# Patient Record
Sex: Female | Born: 1965 | Race: White | Hispanic: No | Marital: Married | Smoking: Former smoker
Health system: Southern US, Community
[De-identification: ages and names within clinical notes are randomized; demographics above are authoritative.]

## PROBLEM LIST (undated history)

## (undated) DIAGNOSIS — M1990 Unspecified osteoarthritis, unspecified site: Secondary | ICD-10-CM

## (undated) DIAGNOSIS — J189 Pneumonia, unspecified organism: Secondary | ICD-10-CM

## (undated) DIAGNOSIS — M797 Fibromyalgia: Secondary | ICD-10-CM

## (undated) DIAGNOSIS — K219 Gastro-esophageal reflux disease without esophagitis: Secondary | ICD-10-CM

## (undated) DIAGNOSIS — D126 Benign neoplasm of colon, unspecified: Secondary | ICD-10-CM

## (undated) DIAGNOSIS — D649 Anemia, unspecified: Secondary | ICD-10-CM

## (undated) DIAGNOSIS — Z8709 Personal history of other diseases of the respiratory system: Secondary | ICD-10-CM

## (undated) DIAGNOSIS — R519 Headache, unspecified: Secondary | ICD-10-CM

## (undated) DIAGNOSIS — M549 Dorsalgia, unspecified: Secondary | ICD-10-CM

## (undated) DIAGNOSIS — F17200 Nicotine dependence, unspecified, uncomplicated: Secondary | ICD-10-CM

## (undated) DIAGNOSIS — E119 Type 2 diabetes mellitus without complications: Secondary | ICD-10-CM

## (undated) HISTORY — PX: CHOLECYSTECTOMY: SHX55

## (undated) HISTORY — PX: ANKLE SURGERY: SHX546

## (undated) HISTORY — PX: OOPHORECTOMY: SHX86

## (undated) HISTORY — PX: ABLATION: SHX5711

## (undated) HISTORY — PX: FOOT SURGERY: SHX648

## (undated) HISTORY — DX: Nicotine dependence, unspecified, uncomplicated: F17.200

## (undated) HISTORY — PX: KNEE ARTHROPLASTY: SHX992

## (undated) HISTORY — PX: EYE SURGERY: SHX253

## (undated) HISTORY — DX: Type 2 diabetes mellitus without complications: E11.9

## (undated) HISTORY — PX: LASIK: SHX215

## (undated) HISTORY — DX: Gastro-esophageal reflux disease without esophagitis: K21.9

## (undated) HISTORY — DX: Benign neoplasm of colon, unspecified: D12.6

## (undated) HISTORY — PX: ANKLE ARTHROPLASTY: SUR68

---

## 1999-12-28 ENCOUNTER — Encounter: Admission: RE | Admit: 1999-12-28 | Discharge: 1999-12-28 | Payer: Self-pay | Admitting: Family Medicine

## 1999-12-28 ENCOUNTER — Encounter: Payer: Self-pay | Admitting: Family Medicine

## 2001-02-14 ENCOUNTER — Encounter: Admission: RE | Admit: 2001-02-14 | Discharge: 2001-02-14 | Payer: Self-pay | Admitting: Chiropractic Medicine

## 2001-02-14 ENCOUNTER — Encounter: Payer: Self-pay | Admitting: Chiropractic Medicine

## 2001-03-11 ENCOUNTER — Other Ambulatory Visit: Admission: RE | Admit: 2001-03-11 | Discharge: 2001-03-11 | Payer: Self-pay | Admitting: Obstetrics and Gynecology

## 2001-12-24 ENCOUNTER — Encounter: Payer: Self-pay | Admitting: Obstetrics and Gynecology

## 2001-12-24 ENCOUNTER — Encounter: Admission: RE | Admit: 2001-12-24 | Discharge: 2001-12-24 | Payer: Self-pay | Admitting: Obstetrics and Gynecology

## 2002-04-07 ENCOUNTER — Other Ambulatory Visit: Admission: RE | Admit: 2002-04-07 | Discharge: 2002-04-07 | Payer: Self-pay | Admitting: Obstetrics and Gynecology

## 2002-07-21 ENCOUNTER — Encounter: Payer: Self-pay | Admitting: Emergency Medicine

## 2002-07-21 ENCOUNTER — Emergency Department (HOSPITAL_COMMUNITY): Admission: EM | Admit: 2002-07-21 | Discharge: 2002-07-21 | Payer: Self-pay | Admitting: Emergency Medicine

## 2002-08-17 ENCOUNTER — Encounter: Payer: Self-pay | Admitting: Obstetrics and Gynecology

## 2002-08-17 ENCOUNTER — Ambulatory Visit (HOSPITAL_COMMUNITY): Admission: RE | Admit: 2002-08-17 | Discharge: 2002-08-17 | Payer: Self-pay | Admitting: Obstetrics and Gynecology

## 2004-05-01 ENCOUNTER — Other Ambulatory Visit: Admission: RE | Admit: 2004-05-01 | Discharge: 2004-05-01 | Payer: Self-pay | Admitting: Obstetrics and Gynecology

## 2006-04-11 ENCOUNTER — Other Ambulatory Visit: Admission: RE | Admit: 2006-04-11 | Discharge: 2006-04-11 | Payer: Self-pay | Admitting: Obstetrics and Gynecology

## 2006-10-29 ENCOUNTER — Encounter: Admission: RE | Admit: 2006-10-29 | Discharge: 2006-10-29 | Payer: Self-pay | Admitting: Obstetrics and Gynecology

## 2007-11-06 ENCOUNTER — Encounter: Admission: RE | Admit: 2007-11-06 | Discharge: 2007-11-06 | Payer: Self-pay | Admitting: Obstetrics and Gynecology

## 2008-12-01 ENCOUNTER — Encounter: Admission: RE | Admit: 2008-12-01 | Discharge: 2008-12-01 | Payer: Self-pay | Admitting: Obstetrics and Gynecology

## 2010-01-31 ENCOUNTER — Encounter: Admission: RE | Admit: 2010-01-31 | Discharge: 2010-01-31 | Payer: Self-pay | Admitting: Obstetrics and Gynecology

## 2011-01-23 ENCOUNTER — Other Ambulatory Visit: Payer: Self-pay | Admitting: Obstetrics and Gynecology

## 2011-01-23 DIAGNOSIS — Z1231 Encounter for screening mammogram for malignant neoplasm of breast: Secondary | ICD-10-CM

## 2011-02-06 ENCOUNTER — Ambulatory Visit: Payer: Self-pay

## 2011-02-14 ENCOUNTER — Ambulatory Visit
Admission: RE | Admit: 2011-02-14 | Discharge: 2011-02-14 | Disposition: A | Discharge status: Home / Self Care | Payer: BC Managed Care – PPO | Source: Ambulatory Visit | Attending: Obstetrics and Gynecology | Admitting: Obstetrics and Gynecology

## 2011-02-14 DIAGNOSIS — Z1231 Encounter for screening mammogram for malignant neoplasm of breast: Secondary | ICD-10-CM

## 2011-02-16 ENCOUNTER — Other Ambulatory Visit: Payer: Self-pay | Admitting: Obstetrics and Gynecology

## 2011-02-16 DIAGNOSIS — R928 Other abnormal and inconclusive findings on diagnostic imaging of breast: Secondary | ICD-10-CM

## 2011-02-23 ENCOUNTER — Ambulatory Visit
Admission: RE | Admit: 2011-02-23 | Discharge: 2011-02-23 | Disposition: A | Discharge status: Home / Self Care | Payer: BC Managed Care – PPO | Source: Ambulatory Visit | Attending: Obstetrics and Gynecology | Admitting: Obstetrics and Gynecology

## 2011-02-23 DIAGNOSIS — R928 Other abnormal and inconclusive findings on diagnostic imaging of breast: Secondary | ICD-10-CM

## 2012-08-23 HISTORY — PX: KNEE ARTHROSCOPY: SUR90

## 2013-03-30 ENCOUNTER — Other Ambulatory Visit: Payer: Self-pay

## 2013-03-30 DIAGNOSIS — Z1231 Encounter for screening mammogram for malignant neoplasm of breast: Secondary | ICD-10-CM

## 2013-04-08 ENCOUNTER — Ambulatory Visit
Admission: RE | Admit: 2013-04-08 | Discharge: 2013-04-08 | Disposition: A | Discharge status: Home / Self Care | Payer: Self-pay | Source: Ambulatory Visit

## 2013-04-08 DIAGNOSIS — Z1231 Encounter for screening mammogram for malignant neoplasm of breast: Secondary | ICD-10-CM

## 2013-05-29 ENCOUNTER — Other Ambulatory Visit: Payer: Self-pay | Admitting: Physician Assistant

## 2013-05-29 DIAGNOSIS — R1011 Right upper quadrant pain: Secondary | ICD-10-CM

## 2013-06-04 ENCOUNTER — Ambulatory Visit
Admission: RE | Admit: 2013-06-04 | Discharge: 2013-06-04 | Disposition: A | Discharge status: Home / Self Care | Payer: BC Managed Care – PPO | Source: Ambulatory Visit | Attending: Physician Assistant | Admitting: Physician Assistant

## 2013-06-04 DIAGNOSIS — R1011 Right upper quadrant pain: Secondary | ICD-10-CM

## 2013-06-19 ENCOUNTER — Encounter (INDEPENDENT_AMBULATORY_CARE_PROVIDER_SITE_OTHER): Payer: Self-pay | Admitting: General Surgery

## 2013-06-22 ENCOUNTER — Ambulatory Visit (INDEPENDENT_AMBULATORY_CARE_PROVIDER_SITE_OTHER): Payer: BC Managed Care – PPO | Admitting: General Surgery

## 2013-06-22 ENCOUNTER — Encounter (INDEPENDENT_AMBULATORY_CARE_PROVIDER_SITE_OTHER): Payer: Self-pay

## 2013-06-22 ENCOUNTER — Encounter (INDEPENDENT_AMBULATORY_CARE_PROVIDER_SITE_OTHER): Payer: Self-pay | Admitting: General Surgery

## 2013-06-22 VITALS — BP 126/90 | HR 64 | Temp 98.6°F | Resp 14 | Ht 68.0 in | Wt 234.4 lb

## 2013-06-22 DIAGNOSIS — K8020 Calculus of gallbladder without cholecystitis without obstruction: Secondary | ICD-10-CM | POA: Insufficient documentation

## 2013-06-22 NOTE — Progress Notes (Signed)
Patient ID: Bethany Johns, female   DOB: [birth year], 47 y.o.   MRN: [id number]  Chief Complaint  Patient presents with  . Follow-up    EVAL CHOLELITHIASIS    HPI Bethany Johns is a 47 y.o. female.  She is referred by Dr. Barton Fanny is for evaluation and management of symptomatic gallstones.  The patient states that she has a six-month history of intermittent attacks of right upper quadrant pain this pain is sharp. This occurred in the afternoon, but she doesn't recognize any other graders. It happens every 3-4 days. No fever or chills. No nausea or vomiting. No diarrhea.  Recent more severe attacks led to evaluation. A CBC was normal recently. Liver function tests were normal in August of 2013, but we do not have any recent liver function tests. Ultrasound shows multiple mobile stones, no inflammation, CBD 5 mm.  Patient is essentially asymptomatic today. She denies any history of liver disease, cardiovascular disease or pulmonary disease.  The only comorbidities are mild obesity. No abdominal surgery. She does smoke a few cigarettes a week but not much. 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  HPI  Past Medical History  Diagnosis Date  . Diabetes mellitus without complication     Past Surgical History  Procedure Laterality Date  . Lasik      eye surgery  . Ankle surgery      torn ligament  . Knee arthroscopy  08/2012    Family History  Problem Relation Age of Onset  . Cancer Father     bladder    Social History History  Substance Use Topics  . Smoking status: Current Some Day Smoker -- 0.25 packs/day  . Smokeless tobacco: Never Used  . Alcohol Use: Yes     Comment: occasional    Allergies  Allergen Reactions  . Amoxicillin Rash      Review of Systems Review of Systems  Constitutional: Negative for fever, chills and unexpected weight change.  HENT: Negative for congestion, hearing loss, sore throat, trouble  swallowing and voice change.   Eyes: Negative for visual disturbance.  Respiratory: Negative for cough and wheezing.   Cardiovascular: Negative for chest pain, palpitations and leg swelling.  Gastrointestinal: Positive for abdominal pain. Negative for nausea, vomiting, diarrhea, constipation, blood in stool, abdominal distention and anal bleeding.  Genitourinary: Negative for hematuria, vaginal bleeding and difficulty urinating.  Musculoskeletal: Negative for arthralgias.  Skin: Negative for rash and wound.  Neurological: Negative for seizures, syncope and headaches.  Hematological: Negative for adenopathy. Does not bruise/bleed easily.  Psychiatric/Behavioral: Negative for confusion.    Blood pressure 126/90, pulse 64, temperature 98.6 F (37 C), temperature source Temporal, resp. rate 14, height 5\' 8"  (1.727 m), weight 234 lb 6.4 oz (106.323 kg).  Physical Exam Physical Exam  Constitutional: She is oriented to person, place, and time. She appears well-developed and well-nourished. No distress.  HENT:  Head: Normocephalic and atraumatic.  Nose: Nose normal.  Mouth/Throat: No oropharyngeal exudate.  Eyes: Conjunctivae and EOM are normal. Pupils are equal, round, and reactive to light. Left eye exhibits no discharge. No scleral icterus.  Neck: Neck supple. No JVD present. No tracheal deviation present. No thyromegaly present.  Cardiovascular: Normal rate, regular rhythm, normal heart sounds and intact distal pulses.   No murmur heard. Pulmonary/Chest: Effort normal and breath sounds normal. No respiratory distress. She has no wheezes. She has no rales. She exhibits no tenderness.  Abdominal: Soft.  Bowel sounds are normal. She exhibits no distension and no mass. There is no tenderness. There is no rebound and no guarding.  Mild, subjective, right subcostal tenderness. No guarding. No mass. No hernia.  Musculoskeletal: She exhibits no edema and no tenderness.  Lymphadenopathy:    She has  no cervical adenopathy.  Neurological: She is alert and oriented to person, place, and time. She exhibits normal muscle tone. Coordination normal.  Skin: Skin is warm. No rash noted. She is not diaphoretic. No erythema. No pallor.  Psychiatric: She has a normal mood and affect. Her behavior is normal. Judgment and thought content normal.    Data Reviewed Dr. Ephriam Knuckles office notes, lab work,  ultrasound.  Assessment    Chronic cholecystitis with cholelithiasis. Accelerating biliary colic. Patient is requesting cholecystectomy At the earliest convenience.  Mild obesity  Mild tobacco abuse     Plan    Schedule for laparoscopic cholecystectomy with cholangiogram, possible open.  I discussed the indications, details, techniques, and numerous risks of the surgery with her. She is aware of the risk of bleeding, infection, conversion to open laparotomy, postop bile leak, incisional hernia, injury to adjacent organs with major reconstructive surgery. She understands all these issues. All of her questions are answered. She agrees with this plan.        Angelia Mould. Derrell Lolling, M.D., [facility] General and Minimally invasive Surgery Breast and Colorectal Surgery Office:   [phone] Pager:   [phone]  06/22/2013, 5:16 PM

## 2013-06-22 NOTE — Patient Instructions (Signed)
You are having frequent attacks of abdominal pain, and this has been going on for almost 6 months. You have gallstones, and your symptoms are almost certainly due to your gallbladder, and almost certainly will continue until you have an operation to remove your gallbladder.  Follow a very low-fat diet  You'll be scheduled for laparoscopic cholecystectomy with cholangiogram in the very near future    Laparoscopic Cholecystectomy Laparoscopic cholecystectomy is surgery to remove the gallbladder. The gallbladder is located slightly to the right of center in the abdomen, behind the liver. It is a concentrating and storage sac for the bile produced in the liver. Bile aids in the digestion and absorption of fats. Gallbladder disease (cholecystitis) is an inflammation of your gallbladder. This condition is usually caused by a buildup of gallstones (cholelithiasis) in your gallbladder. Gallstones can block the flow of bile, resulting in inflammation and pain. In severe cases, emergency surgery may be required. When emergency surgery is not required, you will have time to prepare for the procedure. Laparoscopic surgery is an alternative to open surgery. Laparoscopic surgery usually has a shorter recovery time. Your common bile duct may also need to be examined and explored. Your caregiver will discuss this with you if he or she feels this should be done. If stones are found in the common bile duct, they may be removed. LET YOUR CAREGIVER KNOW ABOUT:  Allergies to food or medicine.  Medicines taken, including vitamins, herbs, eyedrops, over-the-counter medicines, and creams.  Use of steroids (by mouth or creams).  Previous problems with anesthetics or numbing medicines.  History of bleeding problems or blood clots.  Previous surgery.  Other health problems, including diabetes and kidney problems.  Possibility of pregnancy, if this applies. RISKS AND COMPLICATIONS All surgery is associated with  risks. Some problems that may occur following this procedure include:  Infection.  Damage to the common bile duct, nerves, arteries, veins, or other internal organs such as the stomach or intestines.  Bleeding.  A stone may remain in the common bile duct. BEFORE THE PROCEDURE  Do not take aspirin for 3 days prior to surgery or blood thinners for 1 week prior to surgery.  Do not eat or drink anything after midnight the night before surgery.  Let your caregiver know if you develop a cold or other infectious problem prior to surgery.  You should be present 60 minutes before the procedure or as directed. PROCEDURE  You will be given medicine that makes you sleep (general anesthetic). When you are asleep, your surgeon will make several small cuts (incisions) in your abdomen. One of these incisions is used to insert a small, lighted scope (laparoscope) into the abdomen. The laparoscope helps the surgeon see into your abdomen. Carbon dioxide gas will be pumped into your abdomen. The gas allows more room for the surgeon to perform your surgery. Other operating instruments are inserted through the other incisions. Laparoscopic procedures may not be appropriate when:  There is major scarring from previous surgery.  The gallbladder is extremely inflamed.  There are bleeding disorders or unexpected cirrhosis of the liver.  A pregnancy is near term.  Other conditions make the laparoscopic procedure impossible. If your surgeon feels it is not safe to continue with a laparoscopic procedure, he or she will perform an open abdominal procedure. In this case, the surgeon will make an incision to open the abdomen. This gives the surgeon a larger view and field to work within. This may allow the surgeon to perform  procedures that sometimes cannot be performed with a laparoscope alone. Open surgery has a longer recovery time. AFTER THE PROCEDURE  You will be taken to the recovery area where a nurse will  watch and check your progress.  You may be allowed to go home the same day.  Do not resume physical activities until directed by your caregiver.  You may resume a normal diet and activities as directed. Document Released: 06/11/2005 Document Revised: 09/03/2011 Document Reviewed: 01/21/2013 [facility] Patient Information 2014 [place], [place].

## 2013-06-23 ENCOUNTER — Encounter (HOSPITAL_COMMUNITY): Payer: Self-pay

## 2013-06-23 ENCOUNTER — Encounter (HOSPITAL_COMMUNITY)
Admission: RE | Admit: 2013-06-23 | Discharge: 2013-06-23 | Disposition: A | Discharge status: Home / Self Care | Payer: BC Managed Care – PPO | Source: Ambulatory Visit | Attending: General Surgery | Admitting: General Surgery

## 2013-06-23 ENCOUNTER — Encounter (INDEPENDENT_AMBULATORY_CARE_PROVIDER_SITE_OTHER): Payer: Self-pay

## 2013-06-23 ENCOUNTER — Telehealth (INDEPENDENT_AMBULATORY_CARE_PROVIDER_SITE_OTHER): Payer: Self-pay

## 2013-06-23 HISTORY — DX: Unspecified osteoarthritis, unspecified site: M19.90

## 2013-06-23 HISTORY — DX: Personal history of other diseases of the respiratory system: Z87.09

## 2013-06-23 HISTORY — DX: Dorsalgia, unspecified: M54.9

## 2013-06-23 HISTORY — DX: Pneumonia, unspecified organism: J18.9

## 2013-06-23 LAB — CBC WITH DIFFERENTIAL/PLATELET
Basophils Relative: 1 % (ref 0–1)
Eosinophils Absolute: 0.2 10*3/uL (ref 0.0–0.7)
Eosinophils Relative: 3 % (ref 0–5)
HCT: 40.9 % (ref 36.0–46.0)
Hemoglobin: 13.8 g/dL (ref 12.0–15.0)
Lymphocytes Relative: 22 % (ref 12–46)
Lymphs Abs: 1.8 10*3/uL (ref 0.7–4.0)
MCH: 31.3 pg (ref 26.0–34.0)
MCHC: 33.7 g/dL (ref 30.0–36.0)
MCV: 92.7 fL (ref 78.0–100.0)
Monocytes Absolute: 0.6 10*3/uL (ref 0.1–1.0)
Monocytes Relative: 8 % (ref 3–12)
Platelets: 289 10*3/uL (ref 150–400)
RBC: 4.41 MIL/uL (ref 3.87–5.11)
WBC: 8.4 10*3/uL (ref 4.0–10.5)

## 2013-06-23 LAB — HCG, SERUM, QUALITATIVE: Preg, Serum: NEGATIVE

## 2013-06-23 MED ORDER — CHLORHEXIDINE GLUCONATE 4 % EX LIQD: 1.0000 "application " | Freq: Once | CUTANEOUS | Status: DC

## 2013-06-23 NOTE — Telephone Encounter (Signed)
Message copied by Ivory Broad on Tue Jun 23, 2013  1:41 PM ------      Message from: Docia Chuck      Created: Tue Jun 23, 2013 11:50 AM      Regarding: Patient needs note       I have her scheduled for 06/26/13 she needs a note send to her stating she can return to work by 1/7 the earliest or 1/8.            Also the first follow up I could get was 07/27/13 so am not sure that is OK or if you can find her an earlier date, just let her know.            Thanks                         ------

## 2013-06-23 NOTE — Telephone Encounter (Signed)
Left message for pt to call me and I can type up her note for work.  I want to see if she wants the note faxed or mailed.  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  I will schedule her post op later.  I am unsure where to put her appointment.

## 2013-06-23 NOTE — Pre-Procedure Instructions (Signed)
Bethany Johns  06/23/2013   Your procedure is scheduled on:  Fri, Jan 2 @ 11:30 AM  Report to [facility] Stay [facility]tr[facility] A at 9:30 AM.  Call this number if you have problems the morning of surgery: [phone]   Remember:   Do not eat food or drink liquids after midnight.   Take these medicines the morning of surgery with A SIP OF WATER: Fluticasone(Flonase) and Claritin(Loratadine)               Stop taking your Ibuprofen. No Goody's,BC's,Aleve,Aspirin,Fish Oil,or any Herbal Medications   Do not wear jewelry, make-up or nail polish.  Do not wear lotions, powders, or perfumes. You may wear deodorant.  Do not shave 48 hours prior to surgery.   Do not bring valuables to the hospital.  [facility] is not responsible                  for any belongings or valuables.               Contacts, dentures or bridgework may not be worn into surgery.  Leave suitcase in the car. After surgery it may be brought to your room.  For patients admitted to the hospital, discharge time is determined by your                treatment team.               Patients discharged the day of surgery will not be allowed to drive  home.    Special Instructions: Shower using CHG 2 nights before surgery and the night before surgery.  If you shower the day of surgery use CHG.  Use special wash - you have one bottle of CHG for all showers.  You should use approximately 1/3 of the bottle for each shower.   Please read over the following fact sheets that you were given: Pain Booklet, Coughing and Deep Breathing and Surgical Site Infection Prevention

## 2013-06-23 NOTE — Progress Notes (Signed)
Pt doesn't have a cardiologist   Denies ever having an echo/stress test/heart cath  [facility] Dr.Victoria Rankin is Medical MD  Denies EKG or CXR in past yr

## 2013-06-24 NOTE — H&P (Signed)
Bethany Johns   MRN:  [id number]   Description: 47 year old female  Provider: Ernestene Mention, MD  Department: [facility]        Diagnoses      Gallstones    -  Primary      [phone]             Reason for Visit      Follow-up      EVAL CHOLELITHIASIS               Current Vitals - Last Recorded      BP Pulse Temp(Src) Resp Ht Wt      126/90 64 98.6 F (37 C) (Temporal) 14 5\' 8"  (1.727 m) 234 lb 6.4 oz (106.323 kg)     BMI - 35.65 kg/m2                             History and Physical    Ernestene Mention, MD   Status: Signed            Patient ID: Bethany Johns, female   DOB: 1965/11/12, 47 y.o.   MRN: [id number]                  HPI Bethany Johns is a 47 y.o. female.  She is referred by Dr. Barton Fanny is for evaluation and management of symptomatic gallstones.   The patient states that she has a six-month history of intermittent attacks of right upper quadrant pain this pain is sharp. This occurred in the afternoon, but she doesn't recognize any other graders. It happens every 3-4 days. No fever or chills. No nausea or vomiting. No diarrhea.   Recent more severe attacks led to evaluation. A CBC was normal recently. Liver function tests were normal in August of 2013, but we do not have any recent liver function tests. Ultrasound shows multiple mobile stones, no inflammation, CBD 5 mm.   Patient is essentially asymptomatic today. She denies any history of liver disease, cardiovascular disease or pulmonary disease.   The only comorbidities are mild obesity. No abdominal surgery. She does smoke a few cigarettes a week but not much. 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        Past Medical History   Diagnosis  Date   .  Diabetes mellitus without complication           Past Surgical History   Procedure  Laterality  Date   .  Lasik           eye surgery   .  Ankle surgery            torn ligament   .  Knee arthroscopy    08/2012         Family History   Problem  Relation  Age of Onset   .  Cancer  Father         bladder        Social History History   Substance Use Topics   .  Smoking status:  Current Some Day Smoker -- 0.25 packs/day   .  Smokeless tobacco:  Never Used   .  Alcohol Use:  Yes         Comment: occasional         Allergies   Allergen  Reactions   .  Amoxicillin  Rash          Review of Systems   Constitutional: Negative for fever, chills and unexpected weight change.  HENT: Negative for congestion, hearing loss, sore throat, trouble swallowing and voice change.   Eyes: Negative for visual disturbance.  Respiratory: Negative for cough and wheezing.   Cardiovascular: Negative for chest pain, palpitations and leg swelling.  Gastrointestinal: Positive for abdominal pain. Negative for nausea, vomiting, diarrhea, constipation, blood in stool, abdominal distention and anal bleeding.  Genitourinary: Negative for hematuria, vaginal bleeding and difficulty urinating.  Musculoskeletal: Negative for arthralgias.  Skin: Negative for rash and wound.  Neurological: Negative for seizures, syncope and headaches.  Hematological: Negative for adenopathy. Does not bruise/bleed easily.  Psychiatric/Behavioral: Negative for confusion.      Blood pressure 126/90, pulse 64, temperature 98.6 F (37 C), temperature source Temporal, resp. rate 14, height 5\' 8"  (1.727 m), weight 234 lb 6.4 oz (106.323 kg).   Physical Exam   Constitutional: She is oriented to person, place, and time. She appears well-developed and well-nourished. No distress.  HENT:   Head: Normocephalic and atraumatic.   Nose: Nose normal.   Mouth/Throat: No oropharyngeal exudate.  Eyes: Conjunctivae and EOM are normal. Pupils are equal, round, and reactive to light. Left eye exhibits no discharge. No scleral icterus.  Neck: Neck supple. No JVD present. No tracheal deviation  present. No thyromegaly present.  Cardiovascular: Normal rate, regular rhythm, normal heart sounds and intact distal pulses.    No murmur heard. Pulmonary/Chest: Effort normal and breath sounds normal. No respiratory distress. She has no wheezes. She has no rales. She exhibits no tenderness.  Abdominal: Soft. Bowel sounds are normal. She exhibits no distension and no mass. There is no tenderness. There is no rebound and no guarding.  Mild, subjective, right subcostal tenderness. No guarding. No mass. No hernia.  Musculoskeletal: She exhibits no edema and no tenderness.  Lymphadenopathy:    She has no cervical adenopathy.  Neurological: She is alert and oriented to person, place, and time. She exhibits normal muscle tone. Coordination normal.  Skin: Skin is warm. No rash noted. She is not diaphoretic. No erythema. No pallor.  Psychiatric: She has a normal mood and affect. Her behavior is normal. Judgment and thought content normal.      Data Reviewed Dr. Ephriam Knuckles office notes, lab work,  ultrasound.   Assessment    Chronic cholecystitis with cholelithiasis. Accelerating biliary colic. Patient is requesting cholecystectomy At the earliest convenience.   Mild obesity   Mild tobacco abuse      Plan    Schedule for laparoscopic cholecystectomy with cholangiogram, possible open.   I discussed the indications, details, techniques, and numerous risks of the surgery with her. She is aware of the risk of bleeding, infection, conversion to open laparotomy, postop bile leak, incisional hernia, injury to adjacent organs with major reconstructive surgery. She understands all these issues. All of her questions are answered. She agrees with this plan.           Angelia Mould. Derrell Lolling, M.D., [facility] General and Minimally invasive Surgery Breast and Colorectal Surgery Office:   [phone] Pager:   [phone]

## 2013-06-25 MED ORDER — CIPROFLOXACIN IN D5W 400 MG/200ML IV SOLN
400.0000 mg | INTRAVENOUS | Status: DC
  Filled 2013-06-25: qty 200

## 2013-06-26 ENCOUNTER — Encounter (HOSPITAL_COMMUNITY): Payer: Self-pay | Admitting: *Deleted

## 2013-06-26 ENCOUNTER — Encounter (HOSPITAL_COMMUNITY)
Admission: RE | Disposition: A | Discharge status: Home / Self Care | Payer: Self-pay | Source: Ambulatory Visit | Attending: General Surgery

## 2013-06-26 ENCOUNTER — Ambulatory Visit (HOSPITAL_COMMUNITY)
Admission: RE | Admit: 2013-06-26 | Discharge: 2013-06-26 | Disposition: A | Discharge status: Home / Self Care | Payer: BC Managed Care – PPO | Source: Ambulatory Visit | Attending: General Surgery | Admitting: General Surgery

## 2013-06-26 ENCOUNTER — Ambulatory Visit (HOSPITAL_COMMUNITY): Payer: BC Managed Care – PPO

## 2013-06-26 ENCOUNTER — Ambulatory Visit (HOSPITAL_COMMUNITY): Payer: BC Managed Care – PPO | Admitting: Anesthesiology

## 2013-06-26 ENCOUNTER — Encounter (HOSPITAL_COMMUNITY): Payer: BC Managed Care – PPO | Admitting: Anesthesiology

## 2013-06-26 DIAGNOSIS — F17200 Nicotine dependence, unspecified, uncomplicated: Secondary | ICD-10-CM | POA: Insufficient documentation

## 2013-06-26 DIAGNOSIS — E669 Obesity, unspecified: Secondary | ICD-10-CM | POA: Insufficient documentation

## 2013-06-26 DIAGNOSIS — K8010 Calculus of gallbladder with chronic cholecystitis without obstruction: Secondary | ICD-10-CM | POA: Insufficient documentation

## 2013-06-26 DIAGNOSIS — K824 Cholesterolosis of gallbladder: Secondary | ICD-10-CM

## 2013-06-26 DIAGNOSIS — Z01812 Encounter for preprocedural laboratory examination: Secondary | ICD-10-CM | POA: Insufficient documentation

## 2013-06-26 DIAGNOSIS — K8020 Calculus of gallbladder without cholecystitis without obstruction: Secondary | ICD-10-CM | POA: Diagnosis present

## 2013-06-26 DIAGNOSIS — E119 Type 2 diabetes mellitus without complications: Secondary | ICD-10-CM | POA: Insufficient documentation

## 2013-06-26 HISTORY — PX: CHOLECYSTECTOMY: SHX55

## 2013-06-26 SURGERY — LAPAROSCOPIC CHOLECYSTECTOMY WITH INTRAOPERATIVE CHOLANGIOGRAM
Anesthesia: General | Site: Abdomen

## 2013-06-26 MED ORDER — HYDROCODONE-ACETAMINOPHEN 5-325 MG PO TABS: 1.0000 | ORAL_TABLET | ORAL | Status: DC | PRN

## 2013-06-26 MED ORDER — SODIUM CHLORIDE 0.9 % IR SOLN
Status: DC | PRN
  Administered 2013-06-26: 1000 mL

## 2013-06-26 MED ORDER — ACETAMINOPHEN 650 MG RE SUPP: 650.0000 mg | RECTAL | Status: DC | PRN

## 2013-06-26 MED ORDER — LACTATED RINGERS IV SOLN
INTRAVENOUS | Status: DC
  Administered 2013-06-26: 10:00:00 via INTRAVENOUS

## 2013-06-26 MED ORDER — PHENYLEPHRINE HCL 10 MG/ML IJ SOLN
INTRAMUSCULAR | Status: DC | PRN
  Administered 2013-06-26: 80 ug via INTRAVENOUS

## 2013-06-26 MED ORDER — PROPOFOL 10 MG/ML IV BOLUS
INTRAVENOUS | Status: DC | PRN
  Administered 2013-06-26: 200 mg via INTRAVENOUS

## 2013-06-26 MED ORDER — SODIUM CHLORIDE 0.9 % IJ SOLN: 3.0000 mL | Freq: Two times a day (BID) | INTRAMUSCULAR | Status: DC

## 2013-06-26 MED ORDER — BUPIVACAINE-EPINEPHRINE 0.25% -1:200000 IJ SOLN
INTRAMUSCULAR | Status: DC | PRN
  Administered 2013-06-26: 8 mL

## 2013-06-26 MED ORDER — ONDANSETRON HCL 4 MG/2ML IJ SOLN
INTRAMUSCULAR | Status: DC | PRN
  Administered 2013-06-26: 4 mg via INTRAVENOUS

## 2013-06-26 MED ORDER — SODIUM CHLORIDE 0.9 % IJ SOLN: 3.0000 mL | INTRAMUSCULAR | Status: DC | PRN

## 2013-06-26 MED ORDER — LACTATED RINGERS IV SOLN
INTRAVENOUS | Status: DC | PRN
  Administered 2013-06-26 (×2): via INTRAVENOUS

## 2013-06-26 MED ORDER — OXYCODONE HCL 5 MG PO TABS
5.0000 mg | ORAL_TABLET | ORAL | Status: DC | PRN
  Administered 2013-06-26: 10 mg via ORAL

## 2013-06-26 MED ORDER — SODIUM CHLORIDE 0.9 % IV SOLN: 250.0000 mL | INTRAVENOUS | Status: DC | PRN

## 2013-06-26 MED ORDER — OXYCODONE HCL 5 MG PO TABS
ORAL_TABLET | ORAL | Status: AC
  Filled 2013-06-26: qty 2

## 2013-06-26 MED ORDER — ONDANSETRON HCL 4 MG/2ML IJ SOLN: 4.0000 mg | Freq: Once | INTRAMUSCULAR | Status: DC | PRN

## 2013-06-26 MED ORDER — ARTIFICIAL TEARS OP OINT
TOPICAL_OINTMENT | OPHTHALMIC | Status: DC | PRN
  Administered 2013-06-26: 1 via OPHTHALMIC

## 2013-06-26 MED ORDER — NEOSTIGMINE METHYLSULFATE 1 MG/ML IJ SOLN
INTRAMUSCULAR | Status: DC | PRN
  Administered 2013-06-26: 4 mg via INTRAVENOUS

## 2013-06-26 MED ORDER — ROCURONIUM BROMIDE 100 MG/10ML IV SOLN
INTRAVENOUS | Status: DC | PRN
  Administered 2013-06-26: 50 mg via INTRAVENOUS

## 2013-06-26 MED ORDER — BUPIVACAINE-EPINEPHRINE PF 0.25-1:200000 % IJ SOLN
INTRAMUSCULAR | Status: AC
  Filled 2013-06-26: qty 30

## 2013-06-26 MED ORDER — FENTANYL CITRATE 0.05 MG/ML IJ SOLN
INTRAMUSCULAR | Status: DC | PRN
  Administered 2013-06-26: 100 ug via INTRAVENOUS
  Administered 2013-06-26: 50 ug via INTRAVENOUS

## 2013-06-26 MED ORDER — HYDROMORPHONE HCL PF 1 MG/ML IJ SOLN
0.2500 mg | INTRAMUSCULAR | Status: DC | PRN
  Administered 2013-06-26 (×2): 0.5 mg via INTRAVENOUS

## 2013-06-26 MED ORDER — ONDANSETRON HCL 4 MG/2ML IJ SOLN: 4.0000 mg | Freq: Four times a day (QID) | INTRAMUSCULAR | Status: DC | PRN

## 2013-06-26 MED ORDER — MIDAZOLAM HCL 5 MG/5ML IJ SOLN
INTRAMUSCULAR | Status: DC | PRN
  Administered 2013-06-26: 2 mg via INTRAVENOUS

## 2013-06-26 MED ORDER — GLYCOPYRROLATE 0.2 MG/ML IJ SOLN
INTRAMUSCULAR | Status: DC | PRN
  Administered 2013-06-26: .8 mg via INTRAVENOUS
  Administered 2013-06-26: .2 mg via INTRAVENOUS

## 2013-06-26 MED ORDER — FENTANYL CITRATE 0.05 MG/ML IJ SOLN: 25.0000 ug | INTRAMUSCULAR | Status: DC | PRN

## 2013-06-26 MED ORDER — ACETAMINOPHEN 325 MG PO TABS: 650.0000 mg | ORAL_TABLET | ORAL | Status: DC | PRN

## 2013-06-26 MED ORDER — HYDROMORPHONE HCL PF 1 MG/ML IJ SOLN
INTRAMUSCULAR | Status: AC
  Filled 2013-06-26: qty 1

## 2013-06-26 MED ORDER — SODIUM CHLORIDE 0.9 % IV SOLN
INTRAVENOUS | Status: DC | PRN
  Administered 2013-06-26: 13:00:00

## 2013-06-26 MED ORDER — LIDOCAINE HCL (CARDIAC) 20 MG/ML IV SOLN
INTRAVENOUS | Status: DC | PRN
  Administered 2013-06-26: 100 mg via INTRAVENOUS

## 2013-06-26 MED ORDER — SODIUM CHLORIDE 0.9 % IV SOLN: INTRAVENOUS | Status: DC

## 2013-06-26 SURGICAL SUPPLY — 43 items
ADH SKN CLS APL DERMABOND .7 (GAUZE/BANDAGES/DRESSINGS) ×1
APPLIER CLIP ROT 10 11.4 M/L (STAPLE) ×3
APR CLP MED LRG 11.4X10 (STAPLE) ×1
BAG SPEC RTRVL LRG 6X4 10 (ENDOMECHANICALS) ×1
BLADE SURG ROTATE 9660 (MISCELLANEOUS) IMPLANT
CANISTER SUCTION 2500CC (MISCELLANEOUS) ×3 IMPLANT
CHLORAPREP W/TINT 26ML (MISCELLANEOUS) ×3 IMPLANT
CLIP APPLIE ROT 10 11.4 M/L (STAPLE) ×1 IMPLANT
COVER MAYO STAND STRL (DRAPES) ×3 IMPLANT
COVER SURGICAL LIGHT HANDLE (MISCELLANEOUS) ×3 IMPLANT
DECANTER SPIKE VIAL GLASS SM (MISCELLANEOUS) ×6 IMPLANT
DERMABOND ADVANCED (GAUZE/BANDAGES/DRESSINGS) ×2
DERMABOND ADVANCED .7 DNX12 (GAUZE/BANDAGES/DRESSINGS) ×1 IMPLANT
DRAPE C-ARM 42X72 X-RAY (DRAPES) ×3 IMPLANT
DRAPE UTILITY 15X26 W/TAPE STR (DRAPE) ×6 IMPLANT
ELECT REM PT RETURN 9FT ADLT (ELECTROSURGICAL) ×3
ELECTRODE REM PT RTRN 9FT ADLT (ELECTROSURGICAL) ×1 IMPLANT
GLOVE BIOGEL PI IND STRL 6.5 (GLOVE) IMPLANT
GLOVE BIOGEL PI IND STRL 7.0 (GLOVE) IMPLANT
GLOVE BIOGEL PI INDICATOR 6.5 (GLOVE) ×2
GLOVE BIOGEL PI INDICATOR 7.0 (GLOVE) ×2
GLOVE EUDERMIC 7 POWDERFREE (GLOVE) ×3 IMPLANT
GOWN STRL NON-REIN LRG LVL3 (GOWN DISPOSABLE) ×9 IMPLANT
GOWN STRL REIN XL XLG (GOWN DISPOSABLE) ×3 IMPLANT
KIT BASIN OR (CUSTOM PROCEDURE TRAY) ×3 IMPLANT
KIT ROOM TURNOVER OR (KITS) ×3 IMPLANT
NS IRRIG 1000ML POUR BTL (IV SOLUTION) ×3 IMPLANT
OMNIPAQUE 300MG/ML ×2 IMPLANT
PAD ARMBOARD 7.5X6 YLW CONV (MISCELLANEOUS) ×3 IMPLANT
POUCH SPECIMEN RETRIEVAL 10MM (ENDOMECHANICALS) ×3 IMPLANT
SCISSORS LAP 5X35 DISP (ENDOMECHANICALS) ×3 IMPLANT
SET CHOLANGIOGRAPH 5 50 .035 (SET/KITS/TRAYS/PACK) ×3 IMPLANT
SET IRRIG TUBING LAPAROSCOPIC (IRRIGATION / IRRIGATOR) ×3 IMPLANT
SLEEVE ENDOPATH XCEL 5M (ENDOMECHANICALS) ×3 IMPLANT
SPECIMEN JAR SMALL (MISCELLANEOUS) ×3 IMPLANT
SUT MNCRL AB 4-0 PS2 18 (SUTURE) ×3 IMPLANT
TOWEL OR 17X24 6PK STRL BLUE (TOWEL DISPOSABLE) ×3 IMPLANT
TOWEL OR 17X26 10 PK STRL BLUE (TOWEL DISPOSABLE) ×3 IMPLANT
TRAY LAPAROSCOPIC (CUSTOM PROCEDURE TRAY) ×3 IMPLANT
TROCAR XCEL BLUNT TIP 100MML (ENDOMECHANICALS) ×3 IMPLANT
TROCAR XCEL NON-BLD 11X100MML (ENDOMECHANICALS) ×3 IMPLANT
TROCAR XCEL NON-BLD 5MMX100MML (ENDOMECHANICALS) ×3 IMPLANT
TUBING INSUFFLATION (TUBING) ×2 IMPLANT

## 2013-06-26 NOTE — Transfer of Care (Signed)
Immediate Anesthesia Transfer of Care Note  Patient: Bethany Johns  Procedure(s) Performed: Procedure(s): LAPAROSCOPIC CHOLECYSTECTOMY WITH INTRAOPERATIVE CHOLANGIOGRAM, POSSIBLE OPEN (N/A)  Patient Location: PACU  Anesthesia Type:General  Level of Consciousness: awake, alert  and oriented  Airway & Oxygen Therapy: Patient Spontanous Breathing and Patient connected to nasal cannula oxygen  Post-op Assessment: Report given to PACU RN, Post -op Vital signs reviewed and stable and Patient moving all extremities X 4  Post vital signs: Reviewed and stable  Complications: No apparent anesthesia complications

## 2013-06-26 NOTE — Progress Notes (Signed)
NO C/O NAUSEA, PAIN, AND VOIDED

## 2013-06-26 NOTE — Anesthesia Preprocedure Evaluation (Signed)
Anesthesia Evaluation  Patient identified by MRN, date of birth, ID band Patient awake    Reviewed: Allergy & Precautions, H&P , NPO status , Patient's Chart, lab work & pertinent test results  Airway       Dental   Pulmonary Current Smoker,          Cardiovascular     Neuro/Psych    GI/Hepatic   Endo/Other    Renal/GU      Musculoskeletal   Abdominal   Peds  Hematology   Anesthesia Other Findings   Reproductive/Obstetrics                           Anesthesia Physical Anesthesia Plan  ASA: I  Anesthesia Plan: General   Post-op Pain Management:    Induction: Intravenous  Airway Management Planned: Oral ETT  Additional Equipment:   Intra-op Plan:   Post-operative Plan:   Informed Consent: I have reviewed the patients History and Physical, chart, labs and discussed the procedure including the risks, benefits and alternatives for the proposed anesthesia with the patient or authorized representative who has indicated his/her understanding and acceptance.     Plan Discussed with:   Anesthesia Plan Comments:         Anesthesia Quick Evaluation

## 2013-06-26 NOTE — Preoperative (Signed)
Beta Blockers   Reason not to administer Beta Blockers:Not Applicable 

## 2013-06-26 NOTE — Op Note (Signed)
Patient Name:           Bethany Johns   Date of Surgery:        06/26/2013  Pre op Diagnosis:      Chronic cholecystitis with cholelithiasis  Post op Diagnosis:    Same  Procedure:                 Laparoscopic cholecystectomy with cholangiogram  Surgeon:                     Edsel Petrin. Dalbert Batman, M.D., FACS  Assistant:                      Alphonsa Overall, M.D., FACS  Operative Indications:   Bethany Johns is a 48 y.o. female. She is referred by Dr. Harlene Ramus is for evaluation and management of symptomatic gallstones.  The patient states that she has a six-month history of intermittent attacks of right upper quadrant pain.   This pain is sharp. This occurs in the afternoon, usually,  but she doesn't recognize any other triggers. It happens every 3-4 days. No fever or chills. No nausea or vomiting. No diarrhea.     Recent more severe attacks led to evaluation. A CBC was normal recently. Liver function tests were normal in August of 2013, but we do not have any recent liver function tests. Ultrasound shows multiple mobile stones, no inflammation, CBD 5 mm.     Patient was essentially asymptomatic when evaluated last week. . She denies any history of liver disease, cardiovascular disease or pulmonary disease.  The only comorbidities are mild obesity. No abdominal surgery. She does smoke a few cigarettes a week but not much. She is brought to the operating room electively.   Operative Findings:       The gallbladder was thin-walled, discolored, suggesting chronic inflammation. The intraoperative cholangiogram was normal, showing normal intrahepatic and extrahepatic biliary anatomy, a very long cystic duct,  No dilatation, and no filling defects with good flow of contrast into the duodenum. The liver, duodenum, stomach, small intestine, and large intestine were otherwise grossly normal to inspection.  Procedure in Detail:          Following the induction of general endotracheal anesthesia the  patient's abdomen was prepped and draped in sterile fashion.Intravenous antibiotics were given and a surgical time out was performed. 0.5% Marcaine with epinephrine was used as a local infiltration anesthetic.    A vertical incision was made in the lower rim of the umbilicus. The fascia was incised in the midline and the abdominal cavity entered under direct vision. An 11 mm Hassan trocar was inserted and secured the pursestring suture of 0 Vicryl. Pneumoperitoneum was created and a video camera inserted. 11 mm trocars placed in the subxiphoid region and two 5 mm trocars were placed in the right subcostal region. The gallbladder was elevated. The peritoneum overlying the neck of the gallbladder was incised. The cystic duct and cystic artery were isolated and dissected until we could see 2 and only 2 structures going to the gallbladder. The cystic artery was secured with multiple metal clips and divided. A cholangiogram catheter was inserted into the cystic duct and a cholangiogram was obtained with the C-arm, with normal findings as described above. The cholangiogram catheter was then removed, the cystic duct secured with multiple  clips and divided. The gallbladder was dissected with the electrocautery , placed in a specimen bag and removed. Hemostasis was excellent.  The operative field and subphrenic spaces were irrigated. Irrigation fluid was completely clear without any evidence of bleeding or bile leak. The trocars were removed. The pneumoperitoneum was released. The fascia at the umbilicus was closed with 0 Vicryl sutures. Skin incisions were closed with subcuticular sutures of 4-0 Monocryl and Dermabond. The patient tolerated the procedure well and was taken to recovery room in stable condition. EBL 10 cc. Counts correct. Complications none.     Edsel Petrin. Dalbert Batman, M.D., FACS General and Minimally Invasive Surgery Breast and Colorectal Surgery  06/26/2013 1:19 PM

## 2013-06-26 NOTE — Discharge Instructions (Signed)
What to eat:  For your first meals, you should eat lightly; only small meals initially.  If you do not have nausea, you may eat larger meals.  Avoid spicy, greasy and heavy food.    General Anesthesia, Adult, Care After  Refer to this sheet in the next few weeks. These instructions provide you with information on caring for yourself after your procedure. Your health care provider may also give you more specific instructions. Your treatment has been planned according to current medical practices, but problems sometimes occur. Call your health care provider if you have any problems or questions after your procedure.  WHAT TO EXPECT AFTER THE PROCEDURE  After the procedure, it is typical to experience:  Sleepiness.  Nausea and vomiting. HOME CARE INSTRUCTIONS  For the first 24 hours after general anesthesia:  Have a responsible person with you.  Do not drive a car. If you are alone, do not take public transportation.  Do not drink alcohol.  Do not take medicine that has not been prescribed by your health care provider.  Do not sign important papers or make important decisions.  You may resume a normal diet and activities as directed by your health care provider.  Change bandages (dressings) as directed.  If you have questions or problems that seem related to general anesthesia, call the hospital and ask for the anesthetist or anesthesiologist on call. SEEK MEDICAL CARE IF:  You have nausea and vomiting that continue the day after anesthesia.  You develop a rash. SEEK IMMEDIATE MEDICAL CARE IF:  You have difficulty breathing.  You have chest pain.  You have any allergic problems. Document Released: 09/17/2000 Document Revised: 02/11/2013 Document Reviewed: 12/25/2012  [facility] Patient Information 2014 [place], [place].   Laparoscopic Cholecystectomy Care After These instructions give you information on caring for yourself after your procedure. Your doctor may also give you more specific  instructions. Call your doctor if you have any problems or questions after your procedure. HOME CARE  Change your bandages (dressings) as told by your doctor.  Keep the wound dry and clean. Wash the wound gently with soap and water. Pat the wound dry with a clean towel.  Do not take baths, swim, or use hot tubs for 10 days, or as told by your doctor.  Only take medicine as told by your doctor.  Eat a normal diet as told by your doctor.  Do not lift anything heavier than 25 pounds (11.5 kg), or as told by your doctor.  Do not play contact sports for 1 week, or as told by your doctor. GET HELP RIGHT AWAY IF:   Your wound is red, puffy (swollen), or painful.  You have yellowish-white fluid (pus) coming from the wound.  You have fluid draining from the wound for more than 1 day.  You have a bad smell coming from the wound.  Your wound breaks open.  You have a rash.  You have trouble breathing.  You have chest pain.  You have a bad reaction to your medicine.  You have a fever.  You have pain in the shoulders (shoulder strap areas).  You feel dizzy or pass out (faint).  You have severe belly (abdominal) pain.  You feel sick to your stomach (nauseous) or throw up (vomit) for more than 1 day. MAKE SURE YOU:  Understand these instructions.  Will watch your condition.  Will get help right away if you are not doing well or get worse. Document Released: 03/20/2008 Document Revised: 10/06/2012  Document Reviewed: 01/21/2013 [facility] Patient Information 2014 [place].

## 2013-06-26 NOTE — Interval H&P Note (Signed)
History and Physical Interval Note:  06/26/2013 9:47 AM  Bethany Johns  has presented today for surgery, with the diagnosis of gallstones  The goals and the various methods of treatment have been discussed with the patient and family. After consideration of risks, benefits and other options for treatment, the patient has consented to  Procedure(s): LAPAROSCOPIC CHOLECYSTECTOMY WITH INTRAOPERATIVE CHOLANGIOGRAM, POSSIBLE OPEN (N/A) as a surgical intervention .  The patient's history has been reviewed, patient examined, no change in status, stable for surgery.  I have reviewed the patient's chart and labs.  Questions were answered to the patient's satisfaction.     [name]

## 2013-06-29 NOTE — Anesthesia Postprocedure Evaluation (Signed)
  Anesthesia Post-op Note  Patient: Bethany Johns  Procedure(s) Performed: Procedure(s): LAPAROSCOPIC CHOLECYSTECTOMY WITH INTRAOPERATIVE CHOLANGIOGRAM, POSSIBLE OPEN (N/A)  Patient Location: PACU  Anesthesia Type:General  Level of Consciousness: awake, alert , oriented and patient cooperative  Airway and Oxygen Therapy: Patient Spontanous Breathing  Post-op Pain: mild  Post-op Assessment: Post-op Vital signs reviewed, Patient's Cardiovascular Status Stable, Respiratory Function Stable, Patent Airway, No signs of Nausea or vomiting and Pain level controlled  Post-op Vital Signs: stable  Complications: No apparent anesthesia complications

## 2013-07-01 ENCOUNTER — Encounter (INDEPENDENT_AMBULATORY_CARE_PROVIDER_SITE_OTHER): Payer: Self-pay | Admitting: General Surgery

## 2013-07-01 ENCOUNTER — Encounter (HOSPITAL_COMMUNITY): Payer: Self-pay | Admitting: General Surgery

## 2013-07-01 ENCOUNTER — Telehealth (INDEPENDENT_AMBULATORY_CARE_PROVIDER_SITE_OTHER): Payer: Self-pay | Admitting: General Surgery

## 2013-07-01 NOTE — Telephone Encounter (Signed)
Pt called to ask about pain at incision, described as "burning."  Reassured pt that this is not uncommon and to apply ice to site and use NSAIDs for their anti-inflammatory effects.  She will try this now.  Pt has just received FMLA paperwork; advised need to drop off and pay fee, so [employer] will process and sent to her employer.  Letter written per her request to [employer] with restrictions on Monday, 07/06/13.  She will pick up the letter at the front desk same time as leaving the [facility] papers.

## 2013-07-27 ENCOUNTER — Ambulatory Visit (INDEPENDENT_AMBULATORY_CARE_PROVIDER_SITE_OTHER): Payer: BC Managed Care – PPO | Admitting: General Surgery

## 2013-07-27 ENCOUNTER — Encounter (INDEPENDENT_AMBULATORY_CARE_PROVIDER_SITE_OTHER): Payer: Self-pay | Admitting: General Surgery

## 2013-07-27 VITALS — BP 122/86 | HR 76 | Temp 99.0°F | Resp 14 | Ht 68.0 in | Wt 232.6 lb

## 2013-07-27 DIAGNOSIS — K8020 Calculus of gallbladder without cholecystitis without obstruction: Secondary | ICD-10-CM

## 2013-07-27 NOTE — Patient Instructions (Addendum)
You seem to be recovering from your gallbladder surgery without any major complication.  You may resume all normal activities without restriction  Follow a low-fat diet.  Return to see Dr. Dalbert Batman if necessary.

## 2013-07-27 NOTE — Progress Notes (Signed)
Patient ID: Bethany Johns, female   DOB: 12/20/1965, 48 y.o.   MRN: [id number] History: This patient underwent laparoscopic cholecystectomy with cholangiogram on 06/26/2013 for histologically confirmed chronic cholecystitis with cholelithiasis. The cholangiogram was normal.  Exam: Patient appears well in no distress. Abdomen is soft and nontender. All trocar sites are healing well  Assessment: Chronic cholecystitis with cholelithiasis, recovering uneventfully in the postop period following laparoscopic cholecystectomy was cleansed and  Plan:  Diet as discussed Return as necessary.    Edsel Petrin. Dalbert Batman, M.D., [facility] General and Minimally invasive Surgery Breast and Colorectal Surgery Office:   [phone] Pager:   [phone]

## 2014-10-18 ENCOUNTER — Encounter: Payer: Self-pay | Admitting: Podiatry

## 2014-10-18 ENCOUNTER — Ambulatory Visit (INDEPENDENT_AMBULATORY_CARE_PROVIDER_SITE_OTHER): Payer: BLUE CROSS/BLUE SHIELD

## 2014-10-18 ENCOUNTER — Ambulatory Visit (INDEPENDENT_AMBULATORY_CARE_PROVIDER_SITE_OTHER): Payer: BLUE CROSS/BLUE SHIELD | Admitting: Podiatry

## 2014-10-18 DIAGNOSIS — M722 Plantar fascial fibromatosis: Secondary | ICD-10-CM

## 2014-10-18 DIAGNOSIS — M79672 Pain in left foot: Secondary | ICD-10-CM

## 2014-10-18 DIAGNOSIS — M79671 Pain in right foot: Secondary | ICD-10-CM | POA: Diagnosis not present

## 2014-10-18 MED ORDER — TRIAMCINOLONE ACETONIDE 10 MG/ML IJ SUSP
10.0000 mg | Freq: Once | INTRAMUSCULAR | Status: AC
  Administered 2014-10-18: 10 mg

## 2014-10-18 MED ORDER — DICLOFENAC SODIUM 75 MG PO TBEC: 75.0000 mg | DELAYED_RELEASE_TABLET | Freq: Two times a day (BID) | ORAL | Status: DC

## 2014-10-18 NOTE — Patient Instructions (Signed)

## 2014-10-18 NOTE — Progress Notes (Signed)
   Subjective:    Patient ID: Bethany Johns, female    DOB: [birth year], 49 y.o.   MRN: [id number]  HPI Pt presents with bilateral PF pain, worse in the right, requesting orthotics, has had injections in the past along with being casted, and has a h/o surgery on her right foot    Review of Systems  All other systems reviewed and are negative.      Objective:   Physical Exam        Assessment & Plan:

## 2014-10-19 NOTE — Progress Notes (Signed)
Subjective:     Patient ID: Bethany Johns, female   DOB: 28-Mar-1966, 49 y.o.   MRN: [id number]  HPI patient presents stating I been having a significant increase in my heel pain right over left and it never really has gotten better but I have lived with it but the last 6 months have been more difficult   Review of Systems  All other systems reviewed and are negative.      Objective:   Physical Exam  Constitutional: She is oriented to person, place, and time.  Cardiovascular: Intact distal pulses.   Musculoskeletal: Normal range of motion.  Neurological: She is oriented to person, place, and time.  Skin: Skin is warm.  Nursing note and vitals reviewed.  assuming neurovascular status intact muscle strength adequate with range of motion within normal limits. Patient is noted to have intense discomfort plantar fascia right over left at the insertion of the tendon into the calcaneus with fluid buildup noted around the medial band. Patient has good digital perfusion and is well oriented 3     Assessment:     Fasciitis right over left with inflammation and fluid     Plan:     H&P and x-rays reviewed with patient. Today I injected the plantar fascia right over left 3 mg Kenalog 5 mill grams Xylocaine Marcaine mixture and dispensed a fascially brace for the right. Scanned for new orthotics

## 2014-11-08 ENCOUNTER — Encounter: Payer: Self-pay | Admitting: Podiatry

## 2014-11-08 ENCOUNTER — Ambulatory Visit (INDEPENDENT_AMBULATORY_CARE_PROVIDER_SITE_OTHER): Payer: BLUE CROSS/BLUE SHIELD | Admitting: Podiatry

## 2014-11-08 VITALS — BP 127/75 | HR 79 | Resp 18

## 2014-11-08 DIAGNOSIS — M722 Plantar fascial fibromatosis: Secondary | ICD-10-CM | POA: Diagnosis not present

## 2014-11-08 MED ORDER — TRIAMCINOLONE ACETONIDE 10 MG/ML IJ SUSP
10.0000 mg | Freq: Once | INTRAMUSCULAR | Status: AC
  Administered 2014-11-08: 10 mg

## 2014-11-08 NOTE — Progress Notes (Signed)
Subjective:     Patient ID: Bethany Johns, female   DOB: January 19, 1966, 49 y.o.   MRN: [id number]  HPI patient presents stating my heel is still hurting me quite a bit but it seems to be more on the bottom outside than the bottom and side. Points to the right heel   Review of Systems     Objective:   Physical Exam Neurovascular status intact muscle strength adequate range of motion within normal limits with inflammation and pain on the plantar aspect right foot lateral side  plantar fascial tendon    Assessment:     Reviewed condition and did a careful injection from the lateral side 3 mg Kenalog 5 mg Xylocaine and advised on physical therapy and anti-inflammatories.    Plan:     Plantar fasciitis right lateral band noted today I also dispensed a night splint

## 2014-11-08 NOTE — Patient Instructions (Signed)

## 2014-12-06 ENCOUNTER — Encounter: Payer: Self-pay | Admitting: Podiatry

## 2014-12-06 ENCOUNTER — Ambulatory Visit (INDEPENDENT_AMBULATORY_CARE_PROVIDER_SITE_OTHER): Payer: BLUE CROSS/BLUE SHIELD | Admitting: Podiatry

## 2014-12-06 VITALS — BP 113/71 | HR 73 | Resp 15

## 2014-12-06 DIAGNOSIS — M722 Plantar fascial fibromatosis: Secondary | ICD-10-CM

## 2014-12-06 MED ORDER — TRAMADOL HCL 50 MG PO TABS: 50.0000 mg | ORAL_TABLET | Freq: Three times a day (TID) | ORAL | Status: DC

## 2014-12-07 ENCOUNTER — Ambulatory Visit (INDEPENDENT_AMBULATORY_CARE_PROVIDER_SITE_OTHER): Payer: BLUE CROSS/BLUE SHIELD | Admitting: Podiatry

## 2014-12-07 DIAGNOSIS — M722 Plantar fascial fibromatosis: Secondary | ICD-10-CM

## 2014-12-07 NOTE — Patient Instructions (Signed)
Pre-Operative Instructions  Congratulations, you have decided to take an important step to improving your quality of life.  You can be assured that the doctors of [facility] will be with you every step of the way.  1. Plan to be at the surgery center/hospital at least 1 (one) hour prior to your scheduled time unless otherwise directed by the surgical center/hospital staff.  You must have a responsible adult accompany you, remain during the surgery and drive you home.  Make sure you have directions to the surgical center/hospital and know how to get there on time. 2. For hospital based surgery you will need to obtain a history and physical form from your family physician within 1 month prior to the date of surgery- we will give you a form for you primary physician.  3. We make every effort to accommodate the date you request for surgery.  There are however, times where surgery dates or times have to be moved.  We will contact you as soon as possible if a change in schedule is required.   4. No Aspirin/Ibuprofen for one week before surgery.  If you are on aspirin, any non-steroidal anti-inflammatory medications (Mobic, Aleve, Ibuprofen) you should stop taking it 7 days prior to your surgery.  You make take Tylenol  For pain prior to surgery.  5. Medications- If you are taking daily heart and blood pressure medications, seizure, reflux, allergy, asthma, anxiety, pain or diabetes medications, make sure the surgery center/hospital is aware before the day of surgery so they may notify you which medications to take or avoid the day of surgery. 6. No food or drink after midnight the night before surgery unless directed otherwise by surgical center/hospital staff. 7. No alcoholic beverages 24 hours prior to surgery.  No smoking 24 hours prior to or 24 hours after surgery. 8. Wear loose pants or shorts- loose enough to fit over bandages, boots, and casts. 9. No slip on shoes, sneakers are best. 10. Bring  your boot with you to the surgery center/hospital.  Also bring crutches or a walker if your physician has prescribed it for you.  If you do not have this equipment, it will be provided for you after surgery. 11. If you have not been contracted by the surgery center/hospital by the day before your surgery, call to confirm the date and time of your surgery. 12. Leave-time from work may vary depending on the type of surgery you have.  Appropriate arrangements should be made prior to surgery with your employer. 13. Prescriptions will be provided immediately following surgery by your doctor.  Have these filled as soon as possible after surgery and take the medication as directed. 14. Remove nail polish on the operative foot. 15. Wash the night before surgery.  The night before surgery wash the foot and leg well with the antibacterial soap provided and water paying special attention to beneath the toenails and in between the toes.  Rinse thoroughly with water and dry well with a towel.  Perform this wash unless told not to do so by your physician.  Enclosed: 1 Ice pack (please put in freezer the night before surgery)   1 Hibiclens skin cleaner   Pre-op Instructions  If you have any questions regarding the instructions, do not hesitate to call our office.  [place]: [address] [place], [place] [address] [phone]  [place]: [address] [place], [place] [address] [phone]  [place]: [address]  [place], [place] [address] [phone]  Dr. Richard   Tuchman DPM, Dr. Norman Regal DPM Dr. Richard Sikora DPM, Dr. M. Todd Hyatt DPM, Dr. Kathryn Egerton DPM 

## 2014-12-07 NOTE — Progress Notes (Signed)
Subjective:     Patient ID: Bethany Johns, female   DOB: 12-04-1965, 49 y.o.   MRN: [id number]  HPI patient states she is still getting a lot of pain on the plantar aspect of her heel central and lateral band and that it's still causing her significant reduction of activity   Review of Systems     Objective:   Physical Exam Neurovascular status intact with pain in the center lateral aspect of the plantar fascia at its insertion into the calcaneus with fluid buildup noted    Assessment:     Advance plantar fasciitis of the center and lateral band right    Plan:     Discussed treatment options and she denies wanting to wear the boot and states that she will just use her night splint on occasional basis. At this point I recommended shockwave therapy and she is scheduled for this to be done tomorrow and I gave her instructions on night splint usage

## 2014-12-14 ENCOUNTER — Ambulatory Visit (INDEPENDENT_AMBULATORY_CARE_PROVIDER_SITE_OTHER): Payer: BLUE CROSS/BLUE SHIELD | Admitting: Podiatry

## 2014-12-14 DIAGNOSIS — M722 Plantar fascial fibromatosis: Secondary | ICD-10-CM

## 2014-12-14 NOTE — Progress Notes (Signed)
   Subjective:    Patient ID: Bethany Johns, female    DOB: 13-Mar-1966, 49 y.o.   MRN: [id number]  HPI  EPAT treatment number 2. Patient still has some heel pain, but overall improvement since she has started treatment.  Review of Systems     Objective:   Physical Exam        Assessment & Plan:

## 2014-12-14 NOTE — Progress Notes (Signed)
Subjective:     Patient ID: Bethany Johns, female   DOB: March 18, 1966, 49 y.o.   MRN: [id number]  HPI patient presents for endoscopic procedure of the right heel   Review of Systems     Objective:   Physical Exam Neurovascular status intact muscle strength adequate with no change in health history    Assessment:     Plantar fasciitis right    Plan:     Shockwave administered to the level 3.0-3000 shocks 17 frequency and tolerated well

## 2014-12-15 NOTE — Progress Notes (Signed)
Subjective:     Patient ID: Bethany Johns, female   DOB: 1966-01-31, 49 y.o.   MRN: [id number]  HPI patient states my heel seems somewhat better but still tender   Review of Systems     Objective:   Physical Exam Neurovascular status intact with the center and lateral band of the plantar fascia right still tender when pressed    Assessment:     Plantar fasciitis improved but present    Plan:     Shockwave administered 3000 shocks 4.2 intensity 17 frequency. Tolerated well and reappoint to recheck one week

## 2014-12-21 ENCOUNTER — Ambulatory Visit (INDEPENDENT_AMBULATORY_CARE_PROVIDER_SITE_OTHER): Payer: BLUE CROSS/BLUE SHIELD | Admitting: Podiatry

## 2014-12-21 ENCOUNTER — Encounter: Payer: Self-pay | Admitting: Podiatry

## 2014-12-21 VITALS — BP 112/78 | HR 75 | Resp 15

## 2014-12-21 DIAGNOSIS — M722 Plantar fascial fibromatosis: Secondary | ICD-10-CM

## 2014-12-22 NOTE — Progress Notes (Signed)
Subjective:     Patient ID: Bethany Johns, female   DOB: 20-Jun-1966, 49 y.o.   MRN: [id number]  HPI patient states that my heel is improving   Review of Systems     Objective:   Physical Exam Neurovascular status intact with pain in the center and lateral side of the plantar fascia right    Assessment:     Plantar fasciitis right improving with pain in the left plantar fascia    Plan:     Continue treatment of the right which was accomplished with 3000 shocks 4.6 intensity 17 frequency. We may  initiate treatment on the left but that will be decided based on response

## 2015-01-17 ENCOUNTER — Telehealth: Payer: Self-pay | Admitting: *Deleted

## 2015-01-17 NOTE — Telephone Encounter (Signed)
Pt states after vacation both her heels hurt horribly, should she wait to #4 EPAT for the right or come in sooner.  I spoke with pt and she is using the [place], ice and night splint.  Pt states she really would like to be seen before it got worse even if she didn't get to see Dr. Paulla Dolly.  I transferred pt for earlier appt.

## 2015-01-24 ENCOUNTER — Encounter: Payer: Self-pay | Admitting: Podiatry

## 2015-01-24 ENCOUNTER — Ambulatory Visit (INDEPENDENT_AMBULATORY_CARE_PROVIDER_SITE_OTHER): Payer: BLUE CROSS/BLUE SHIELD | Admitting: Podiatry

## 2015-01-24 DIAGNOSIS — M722 Plantar fascial fibromatosis: Secondary | ICD-10-CM

## 2015-01-26 NOTE — Progress Notes (Signed)
Subjective:     Patient ID: Bethany Johns, female   DOB: [birth year], 49 y.o.   MRN: [id number]  HPI patient presents stating I started to develop a lot of pain in both my feet and the right one was doing really well but then I was in the sand and now seems to be hurting me but the left heel has been very tender   Review of Systems     Objective:   Physical Exam Neurovascular status intact with muscle strength adequate and significant discomfort medial fascial band left with minimal discomfort on the right upon palpation but pain if she does a lot of walking    Assessment:     Acute plantar fasciitis left minimal symptoms on the right with doing well with after shockwave therapy right    Plan:     Initiate shockwave therapy on the left approximate 3000 to the heel at 17 frequency and 4.0 intensity. I also went ahead and did the arch of both feet with 1000 pulses at 3.0

## 2015-02-02 ENCOUNTER — Ambulatory Visit: Payer: BLUE CROSS/BLUE SHIELD | Admitting: Podiatry

## 2015-02-03 ENCOUNTER — Encounter: Payer: Self-pay | Admitting: Podiatry

## 2015-02-03 ENCOUNTER — Ambulatory Visit (INDEPENDENT_AMBULATORY_CARE_PROVIDER_SITE_OTHER): Payer: BLUE CROSS/BLUE SHIELD | Admitting: Podiatry

## 2015-02-03 DIAGNOSIS — M722 Plantar fascial fibromatosis: Secondary | ICD-10-CM

## 2015-02-03 NOTE — Progress Notes (Signed)
Subjective:     Patient ID: Bethany Johns, female   DOB: 1966/04/12, 49 y.o.   MRN: [id number]  HPI patient states I'm here for my second treatment on my left and my right still hurts me on the outside and bottom. States the left is some better from the first treatment   Review of Systems     Objective:   Physical Exam Discomfort noted in the medial and central band of the left plantar fascia and in the central and lateral band of the right plantar fascia    Assessment:     Plantar fasciitis still present and today we focused on the left and I did a 3000 shocks at 4.4 intensity at 17 frequency    Plan:     Above administered

## 2015-02-09 ENCOUNTER — Encounter: Payer: Self-pay | Admitting: Podiatry

## 2015-02-09 ENCOUNTER — Ambulatory Visit (INDEPENDENT_AMBULATORY_CARE_PROVIDER_SITE_OTHER): Payer: BLUE CROSS/BLUE SHIELD | Admitting: Podiatry

## 2015-02-09 DIAGNOSIS — M722 Plantar fascial fibromatosis: Secondary | ICD-10-CM

## 2015-02-09 NOTE — Progress Notes (Signed)
Subjective:     Patient ID: Bethany Johns, female   DOB: April 17, 1966, 49 y.o.   MRN: [id number]  HPI patient presents for shockwave #3 left stating she's doing some better but still can get discomfort   Review of Systems     Objective:   Physical Exam Neurovascular status intact with discomfort in the plantar aspect of the left heel right heel at this time but moderate in its intensity    Assessment:     I believe improving plantar fasciitis bilateral    Plan:     Shockwave #3 administered left 3000 shocks at 4.6 intensity 17 frequency and reappoint 4 weeks we will consider doing both at the same time

## 2015-02-15 ENCOUNTER — Ambulatory Visit: Payer: BLUE CROSS/BLUE SHIELD | Admitting: Podiatry

## 2015-03-09 ENCOUNTER — Ambulatory Visit (INDEPENDENT_AMBULATORY_CARE_PROVIDER_SITE_OTHER): Payer: BLUE CROSS/BLUE SHIELD | Admitting: Podiatry

## 2015-03-09 ENCOUNTER — Encounter: Payer: Self-pay | Admitting: Podiatry

## 2015-03-09 DIAGNOSIS — M722 Plantar fascial fibromatosis: Secondary | ICD-10-CM | POA: Diagnosis not present

## 2015-03-09 NOTE — Progress Notes (Signed)
Subjective:     Patient ID: Bethany Johns, female   DOB: 05/17/1966, 49 y.o.   MRN: [id number]  HPI patient states I cannot take the pain in my left foot anymore or my right heel and I want surgery   Review of Systems     Objective:   Physical Exam Neurovascular status intact muscle strength adequate range of motion within normal limits with patient having significant discomfort plantar fascial left and right at the insertional point tendon into the calcaneus of long-term nature with failure to respond to numerous conservative treatments    Assessment:     Chronic plantar fasciitis bilateral    Plan:     Reviewed condition and recommended endoscopic release of the fascial band. We will do one at a time and we'll do the left one first and I allowed her to read consent form discussing alternative treatments and complications. Patient is comfortable with this wants surgery signed consent form after extensive review. She understands total recovery can take up to 6 months and some people will get pain in her arch or other discomfort. At this time I dispensed air fracture walker for postoperative usage and gave her all preoperative instructions and set the date for surgery and encouraged to call with any questions prior to procedure

## 2015-03-09 NOTE — Patient Instructions (Signed)
Pre-Operative Instructions  Congratulations, you have decided to take an important step to improving your quality of life.  You can be assured that the doctors of [facility] will be with you every step of the way.  1. Plan to be at the surgery center/hospital at least 1 (one) hour prior to your scheduled time unless otherwise directed by the surgical center/hospital staff.  You must have a responsible adult accompany you, remain during the surgery and drive you home.  Make sure you have directions to the surgical center/hospital and know how to get there on time. 2. For hospital based surgery you will need to obtain a history and physical form from your family physician within 1 month prior to the date of surgery- we will give you a form for you primary physician.  3. We make every effort to accommodate the date you request for surgery.  There are however, times where surgery dates or times have to be moved.  We will contact you as soon as possible if a change in schedule is required.   4. No Aspirin/Ibuprofen for one week before surgery.  If you are on aspirin, any non-steroidal anti-inflammatory medications (Mobic, Aleve, Ibuprofen) you should stop taking it 7 days prior to your surgery.  You make take Tylenol  For pain prior to surgery.  5. Medications- If you are taking daily heart and blood pressure medications, seizure, reflux, allergy, asthma, anxiety, pain or diabetes medications, make sure the surgery center/hospital is aware before the day of surgery so they may notify you which medications to take or avoid the day of surgery. 6. No food or drink after midnight the night before surgery unless directed otherwise by surgical center/hospital staff. 7. No alcoholic beverages 24 hours prior to surgery.  No smoking 24 hours prior to or 24 hours after surgery. 8. Wear loose pants or shorts- loose enough to fit over bandages, boots, and casts. 9. No slip on shoes, sneakers are best. 10. Bring  your boot with you to the surgery center/hospital.  Also bring crutches or a walker if your physician has prescribed it for you.  If you do not have this equipment, it will be provided for you after surgery. 11. If you have not been contracted by the surgery center/hospital by the day before your surgery, call to confirm the date and time of your surgery. 12. Leave-time from work may vary depending on the type of surgery you have.  Appropriate arrangements should be made prior to surgery with your employer. 13. Prescriptions will be provided immediately following surgery by your doctor.  Have these filled as soon as possible after surgery and take the medication as directed. 14. Remove nail polish on the operative foot. 15. Wash the night before surgery.  The night before surgery wash the foot and leg well with the antibacterial soap provided and water paying special attention to beneath the toenails and in between the toes.  Rinse thoroughly with water and dry well with a towel.  Perform this wash unless told not to do so by your physician.  Enclosed: 1 Ice pack (please put in freezer the night before surgery)   1 Hibiclens skin cleaner   Pre-op Instructions  If you have any questions regarding the instructions, do not hesitate to call our office.  [place]: [address] [place], [place] [address] [phone]  [place]: [address] [place], [place] [address] [phone]  [place]: [address]  [place],  [address] [phone]  Dr. Richard   Tuchman DPM, Dr. Norman Regal DPM Dr. Richard Sikora DPM, Dr. M. Todd Hyatt DPM, Dr. Kathryn Egerton DPM 

## 2015-03-14 ENCOUNTER — Telehealth: Payer: Self-pay | Admitting: *Deleted

## 2015-03-14 NOTE — Telephone Encounter (Signed)
"  I don't mean to call in panic mode but I haven't heard from the surgical center about a time.  I'm still scheduled aren't I?"  Yes, you are still scheduled.  You should hear from the surgical center a day or 2 prior to surgery date.  They may call you this Friday.  "Okay, I didn't know if I needed to fill out some paperwork or what before I go."  Did you register with them on-line?  "No, I don't have access to a computer.  Only computer I use is through work and they have stuff blocked."  Okay, just give the surgical center a call and give them the needed information.  "Okay I will, thank you."

## 2015-03-22 DIAGNOSIS — M722 Plantar fascial fibromatosis: Secondary | ICD-10-CM | POA: Diagnosis not present

## 2015-03-25 ENCOUNTER — Telehealth: Payer: Self-pay | Admitting: *Deleted

## 2015-03-25 NOTE — Telephone Encounter (Signed)
Pt asked if she was to get an antibiotic post op.  I told her Dr. Paulla Dolly did not always give antibiotics post op.  Pt states understanding.

## 2015-03-28 ENCOUNTER — Telehealth: Payer: Self-pay | Admitting: *Deleted

## 2015-03-28 NOTE — Telephone Encounter (Signed)
I attempted to call patient to see how she was doing following surgery.  Line was busy.

## 2015-03-28 NOTE — Telephone Encounter (Signed)
Pt asked if she could change her dressing.  Left her a message the dressing stayed in place until the 04/01/2015 appt.

## 2015-03-29 NOTE — Telephone Encounter (Signed)
Pt states missed my call yesterday, still has questions.  Pt states she is back a work as of yesterday, now experiencing aching and swelling, is at desk with the foot propped some, and up walking some but not a lot.  I told pt that what she was experiencing was normal the more she had to be up walking the more swelling, and aching, but her foot would eventually accommodate the day to day swelling and the surgical swelling especially if she wore the ace to help "train" the foot not to swell.  Pt states she is having more pain in the back of the heel feels like it is where the heel rubs the boot.  I told the pt that in this point and time she should only be up on the surgical foot 10-15 minutes/hour, and the increased walking and decreased dressing would also cause the sensation of rubbing, to decrease the walking and may want to either slightly tug cam walker sock forward in the frame or pad off the painful area.  Pt asked if she could wipe exposed skin to clean, and I told her that would be fine.

## 2015-04-01 ENCOUNTER — Ambulatory Visit (INDEPENDENT_AMBULATORY_CARE_PROVIDER_SITE_OTHER): Payer: BLUE CROSS/BLUE SHIELD | Admitting: Podiatry

## 2015-04-01 DIAGNOSIS — Z9889 Other specified postprocedural states: Secondary | ICD-10-CM

## 2015-04-01 DIAGNOSIS — M722 Plantar fascial fibromatosis: Secondary | ICD-10-CM | POA: Diagnosis not present

## 2015-04-02 NOTE — Progress Notes (Signed)
Subjective:     Patient ID: Bethany Johns, female   DOB: 08-12-1965, 49 y.o.   MRN: [id number]  HPI patient states I'm doing great with my left heel and I would like to get my right heel fixed as soon as possible   Review of Systems     Objective:   Physical Exam Neurovascular status intact negative Homans sign noted wound edges well coapted with minimal plantar pain upon palpation left    Assessment:     Improving from endoscopic release medial fascial band left    Plan:     Reviewed condition and reapplied sterile dressing and instructed on suture removal. For the right allow patient to review consent form going over all possible complications as listed and patient wants endoscopic release right which will be done in approximately 3 weeks. Understands risk and there is no guarantees that just because 1 did so well that the other one well and she understands this and accept risk and wants surgery

## 2015-04-15 ENCOUNTER — Encounter: Payer: Self-pay | Admitting: Podiatry

## 2015-04-15 ENCOUNTER — Ambulatory Visit (INDEPENDENT_AMBULATORY_CARE_PROVIDER_SITE_OTHER): Payer: BLUE CROSS/BLUE SHIELD | Admitting: Podiatry

## 2015-04-15 VITALS — BP 126/79 | HR 76 | Resp 18

## 2015-04-15 DIAGNOSIS — Z9889 Other specified postprocedural states: Secondary | ICD-10-CM

## 2015-04-15 DIAGNOSIS — M722 Plantar fascial fibromatosis: Secondary | ICD-10-CM

## 2015-04-18 NOTE — Progress Notes (Signed)
Patient ID: Bethany Johns, female   DOB: [birth year], 49 y.o.   MRN: [id number]  Subjective: 49 year old female presents the office today status post left endoscopic plantar fascial release with Dr. Paulla Dolly. She presents they for suture removal on the left foot. She states that she is doing well and she has very minimal discomfort. Has been wearing the surgical shoe. She has been icing and elevating. She has not required pain medicine. No other complaints this time. Denies any systemic complaints as fevers, chills, nausea, vomiting. No calf pain, chest pain, shortness of breath. No other complaints at this time.  Objective: AAO 3, NAD DP/PT pulses 2/4, CRT less than 3 seconds Protective sensation intact with Simms Weinstein monofilament Sutures are intact the left rear foot from recent surgery. The incisions are both well coapted without any evidence dehiscence and sutures are intact. There is no surrounding erythema, ascending cellulitis, proximal, crepitus, malodor. There is no drainage. There is no severe edema over surgical site. There is no tenderness to the plantar medial tubercle of the calcaneus to the insertion of plantar fascia. No open lesions or pre-ulcerative lesions. No pain with calf compression, swelling, warmth, erythema.  Assessment: 49 year old female status post left endoscopic plantar fascial release doing well  Plan: -Treatment options discussed including all alternatives, risks, and complications -Sutures removed without complications. Antibiotic was placed over the incisions followed by a dressing. She can with the bandages tomorrow and start to shower along the incision remains closed. Physician problems call the office. -Continue ice and elevation -Recommended continue the night splint or cam boot at night. Range of motion/rehabilitation exercises for plantar fasciitis -She can start to transition to a regular shoe as tolerated. -She is scheduled to have surgery in the right  foot in about 2 weeks. Discussed with her that as any issues prior to the surgery she should follow-up with Dr. Paulla Dolly before.  Celesta Gentile, DPM

## 2015-04-26 DIAGNOSIS — M722 Plantar fascial fibromatosis: Secondary | ICD-10-CM | POA: Diagnosis not present

## 2015-05-06 ENCOUNTER — Ambulatory Visit (INDEPENDENT_AMBULATORY_CARE_PROVIDER_SITE_OTHER): Payer: BLUE CROSS/BLUE SHIELD | Admitting: Podiatry

## 2015-05-06 DIAGNOSIS — Z9889 Other specified postprocedural states: Secondary | ICD-10-CM

## 2015-05-06 DIAGNOSIS — M722 Plantar fascial fibromatosis: Secondary | ICD-10-CM

## 2015-05-08 NOTE — Progress Notes (Signed)
Subjective:     Patient ID: Bethany Johns, female   DOB: 07-Nov-1965, 49 y.o.   MRN: [id number]  HPI patient states I'm doing real well with my right foot and I am having minimal discomfort   Review of Systems     Objective:   Physical Exam Neurovascular status intact muscle strength adequate with well-healing surgical site medial and lateral heel right with wound edges well coapted and good alignment noted    Assessment:     Doing well post endoscopic surgery right heel    Plan:     Reapplied sterile dressing instructed on continued immobilization and dispensed surgical shoe. Reappoint for suture removal 2 weeks or earlier if needed

## 2015-05-30 ENCOUNTER — Telehealth: Payer: Self-pay | Admitting: Podiatry

## 2015-05-30 NOTE — Telephone Encounter (Signed)
Left vm for pt. To call to r/s appt

## 2015-06-02 IMAGING — US US ABDOMEN LIMITED
1 series · 14 of 25 positions shown · non-contrast
Comparison: None.

CLINICAL DATA: Right upper quadrant pain

EXAM:
US ABDOMEN LIMITED - RIGHT UPPER QUADRANT

[Series 1: us abdomen limited · 0.30mm/px · 14 of 44 slices shown]
[im 1/44]
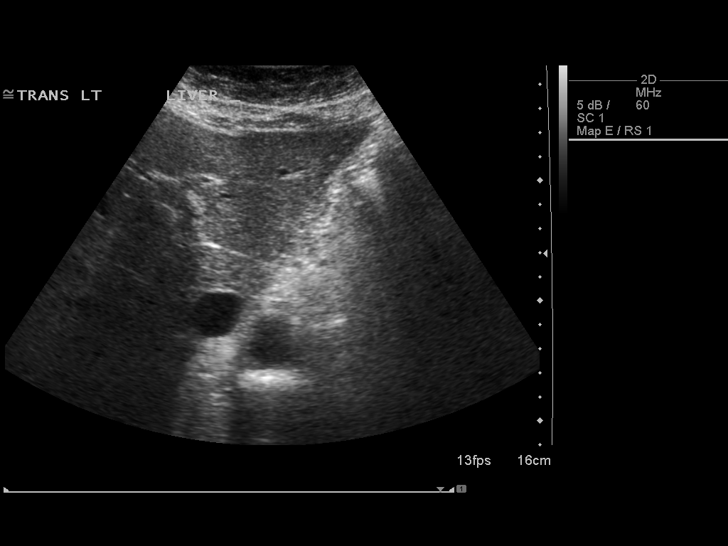
[im 4/44]
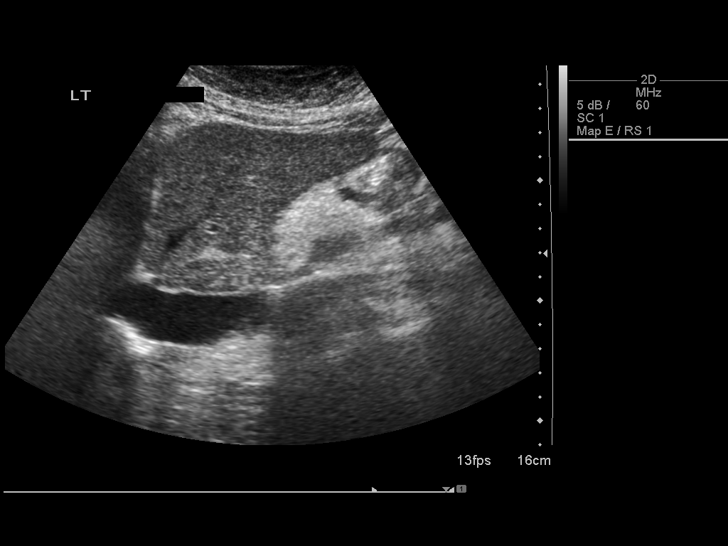
[im 8/44]
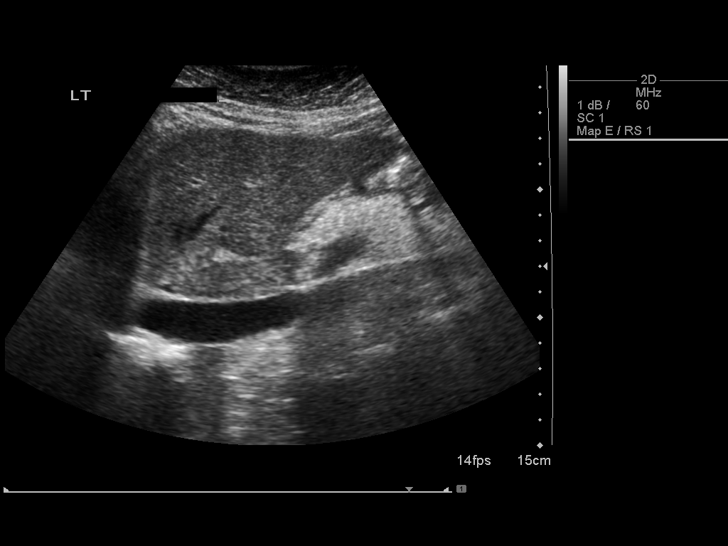
[im 11/44]
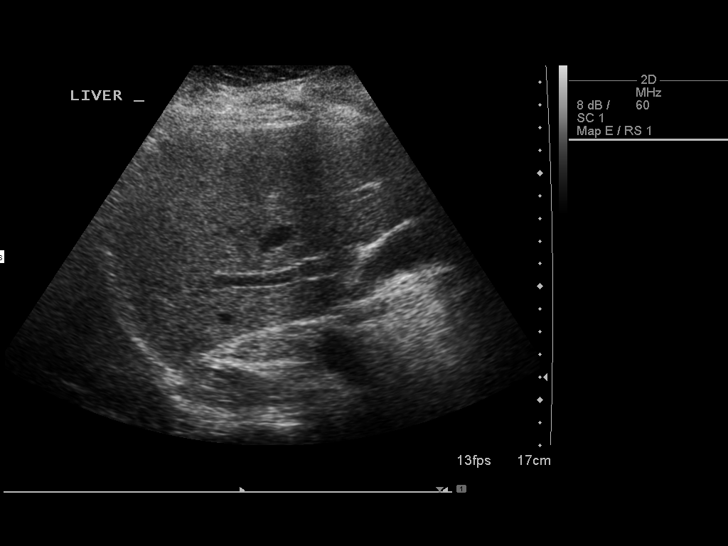
[im 15/44]
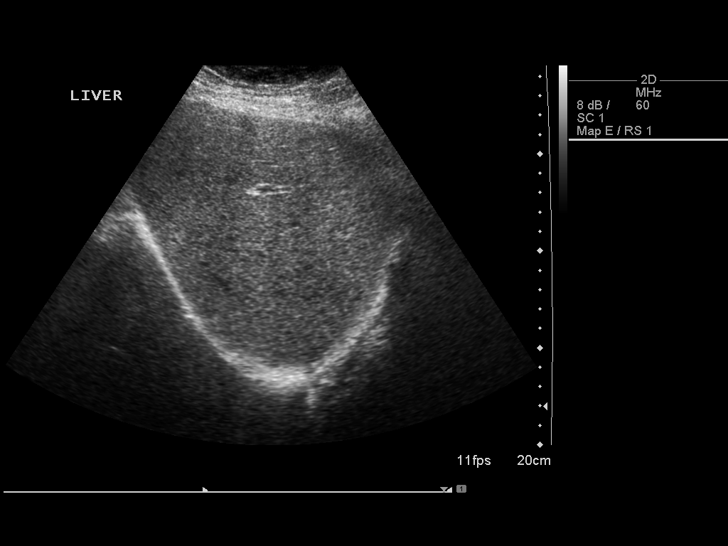
[im 17/44]
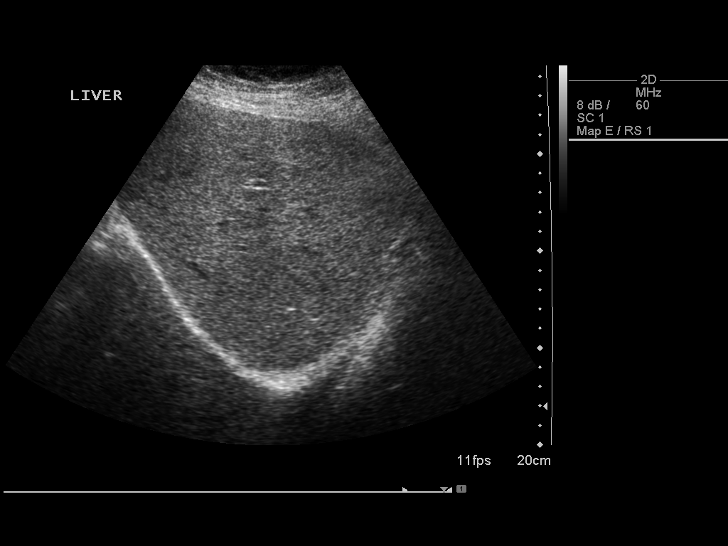
[im 20/44]
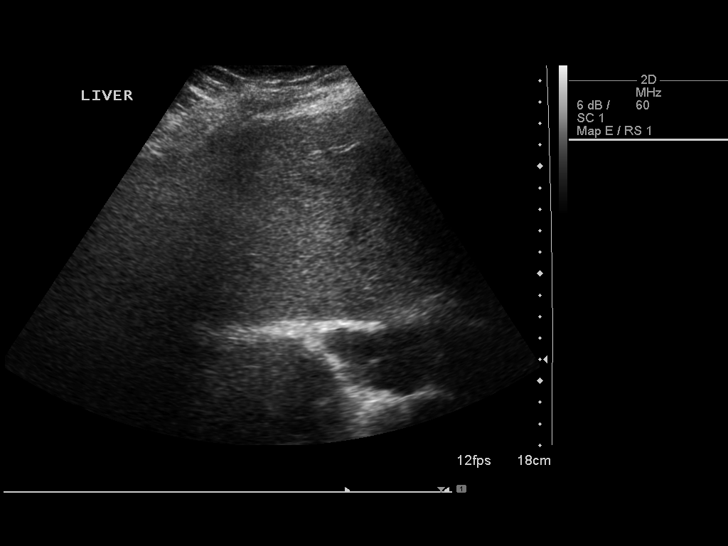
[im 24/44]
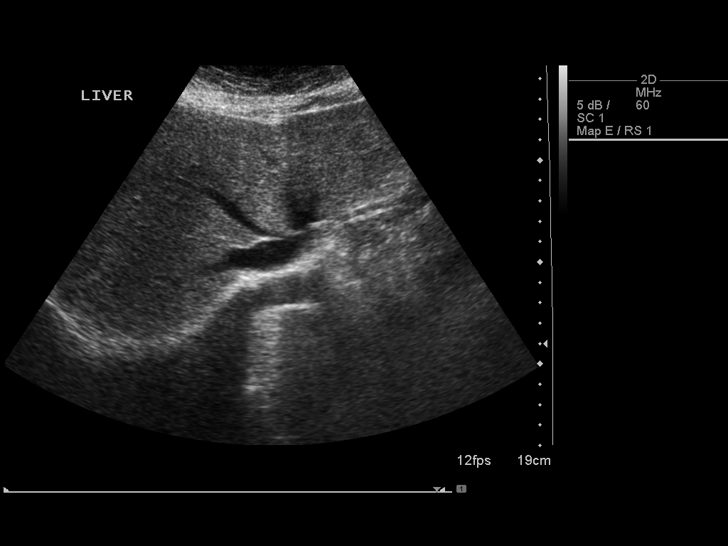
[im 27/44]
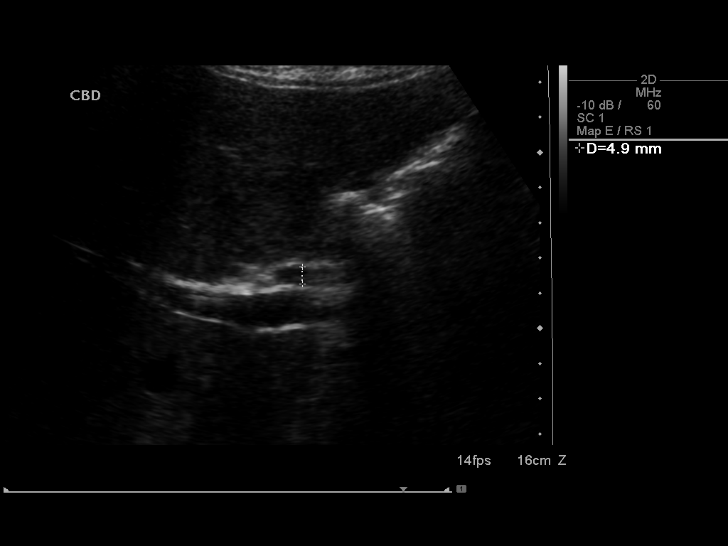
[im 29/44]
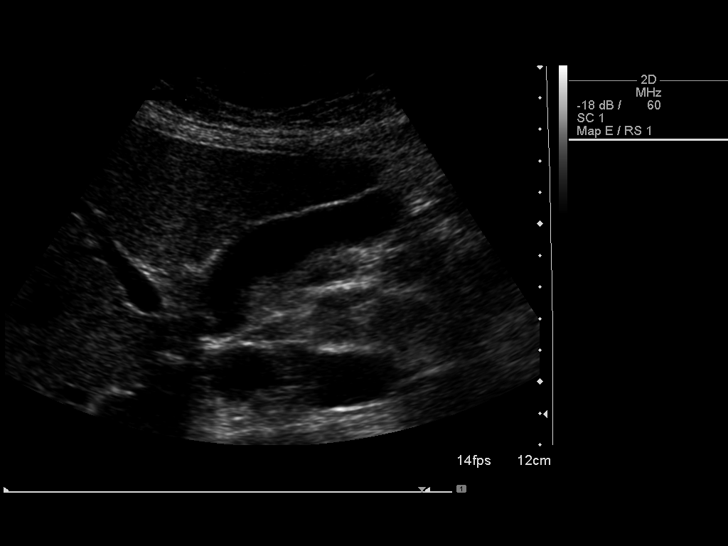
[im 33/44]
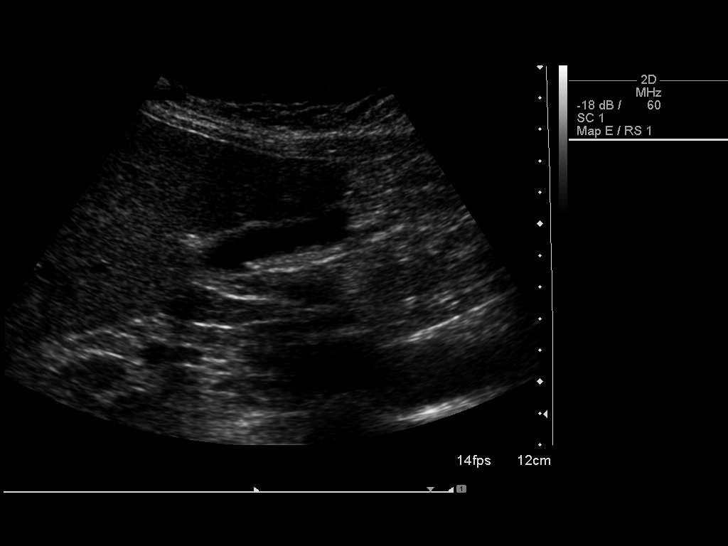
[im 36/44]
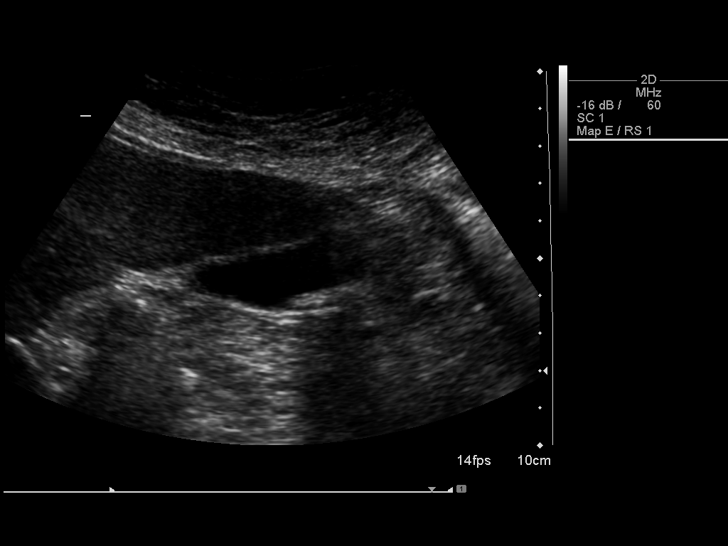
[im 40/44]
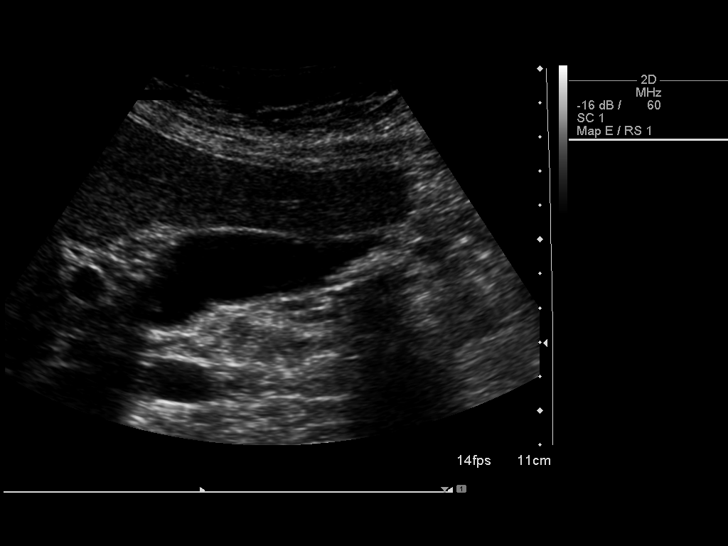
[im 44/44]
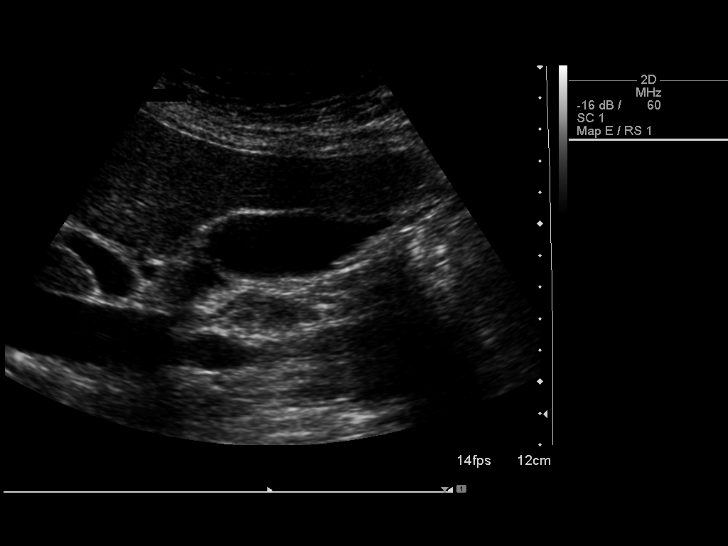

[14 of 25 positions shown; findings below may reference images not displayed]

FINDINGS: Gallbladder

Within the gallbladder, there are echogenic foci which move and
shadow consistent with gallstones. There is no gallbladder wall
thickening or pericholecystic fluid. No sonographic Murphy sign
noted.

Common bile duct

Diameter: 5 mm. There is no intrahepatic or extrahepatic biliary
duct dilatation.

Liver:

No focal lesion identified. Within normal limits in parenchymal
echogenicity.
IMPRESSION: Cholelithiasis.  Study otherwise unremarkable.

## 2015-06-03 ENCOUNTER — Ambulatory Visit (INDEPENDENT_AMBULATORY_CARE_PROVIDER_SITE_OTHER): Payer: BLUE CROSS/BLUE SHIELD | Admitting: Podiatry

## 2015-06-03 DIAGNOSIS — Z9889 Other specified postprocedural states: Secondary | ICD-10-CM

## 2015-06-03 DIAGNOSIS — M722 Plantar fascial fibromatosis: Secondary | ICD-10-CM

## 2015-06-03 NOTE — Patient Instructions (Signed)

## 2015-06-05 NOTE — Progress Notes (Signed)
Subjective:     Patient ID: Bethany Johns, female   DOB: Dec 29, 1965, 49 y.o.   MRN: [id number]  HPI patient states I'm doing well with my right heel   Review of Systems     Objective:   Physical Exam Neurovascular status intact negative Homans sign noted wound edges well coapted medial lateral right heel with minimal discomfort upon plantar palpation    Assessment:     Doing well post endoscopic surgery right    Plan:     Advised on physical therapy reapplied sterile dressing and advised on continued immobilization. Reappoint 2 weeks for stitch removal or earlier if any issues should occur

## 2015-06-13 ENCOUNTER — Encounter (HOSPITAL_COMMUNITY): Payer: Self-pay

## 2015-06-13 ENCOUNTER — Encounter (HOSPITAL_COMMUNITY)
Admission: RE | Admit: 2015-06-13 | Discharge: 2015-06-13 | Disposition: A | Discharge status: Home / Self Care | Payer: BLUE CROSS/BLUE SHIELD | Source: Ambulatory Visit | Attending: Obstetrics and Gynecology | Admitting: Obstetrics and Gynecology

## 2015-06-13 DIAGNOSIS — N83202 Unspecified ovarian cyst, left side: Secondary | ICD-10-CM | POA: Diagnosis present

## 2015-06-13 DIAGNOSIS — Z881 Allergy status to other antibiotic agents status: Secondary | ICD-10-CM | POA: Diagnosis not present

## 2015-06-13 DIAGNOSIS — N8302 Follicular cyst of left ovary: Secondary | ICD-10-CM | POA: Diagnosis not present

## 2015-06-13 DIAGNOSIS — N920 Excessive and frequent menstruation with regular cycle: Secondary | ICD-10-CM | POA: Diagnosis present

## 2015-06-13 LAB — CBC
HEMATOCRIT: 37.1 % (ref 36.0–46.0)
Hemoglobin: 12.1 g/dL (ref 12.0–15.0)
MCH: 31.5 pg (ref 26.0–34.0)
MCHC: 32.6 g/dL (ref 30.0–36.0)
MCV: 96.6 fL (ref 78.0–100.0)
PLATELETS: 289 10*3/uL (ref 150–400)
RBC: 3.84 MIL/uL — AB (ref 3.87–5.11)
RDW: 13.1 % (ref 11.5–15.5)
WBC: 7.7 10*3/uL (ref 4.0–10.5)

## 2015-06-13 LAB — BASIC METABOLIC PANEL
Anion gap: 7 (ref 5–15)
BUN: 16 mg/dL (ref 6–20)
CHLORIDE: 109 mmol/L (ref 101–111)
CO2: 24 mmol/L (ref 22–32)
Calcium: 8.7 mg/dL — ABNORMAL LOW (ref 8.9–10.3)
Creatinine, Ser: 0.58 mg/dL (ref 0.44–1.00)
Glucose, Bld: 100 mg/dL — ABNORMAL HIGH (ref 65–99)
POTASSIUM: 3.8 mmol/L (ref 3.5–5.1)
SODIUM: 140 mmol/L (ref 135–145)

## 2015-06-13 NOTE — Patient Instructions (Addendum)
   Your procedure is scheduled on: December  21 (wednesday )  Enter through the [employer] [facility] at: [facility] up the phone at the desk and dial [phone] and inform us of your arrival.  Please call this number if you have any problems the morning of surgery: [phone]  DO NOT EAT OR DRINK AFTER MIDNIGHT December 20 (TUESDAY)   Do not wear jewelry, make-up, or FINGER nail polish No metal in your hair or on your body. Do not wear lotions, powders, perfumes.  You may wear deodorant.  Do not bring valuables to the hospital. Contacts, dentures or bridgework may not be worn into surgery.    Patients discharged on the day of surgery will not be allowed to drive home.

## 2015-06-14 NOTE — H&P (Signed)
Bethany Johns is an 49 y.o. female G1P1 presents for scheduled novasure ablation for menorrhagia and laparoscopic LSO for a persistent left ovarian mass.  Pt has a long h/o menorrhagia and reports cycles  very heavy and lasting 7-10 days. SIUS and EMB WNL 12/2014. She is having no pain or symptoms from the ovarian cyst/lesion which was incidentally noted on her w/u for the menorrhagia.   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 Slight increase in size between the 2 scans from 1 cm to 1.5 cm. Pt would like to have removed for definitive diagnosis.    Pertinent Gynecological History:  OB History:  NSVD x 1   Menstrual History:  No LMP recorded.    Past Medical History  Diagnosis Date  . History of bronchitis     about 80yrs ago  . Pneumonia     as a child  . Arthritis     both knees  . Back pain     d/t sitting at a computer all day    Past Surgical History  Procedure Laterality Date  . Lasik      eye surgery  . Ankle surgery Right     torn ligament  . Knee arthroscopy Right 08/2012  . Cholecystectomy N/A 06/26/2013    Procedure: LAPAROSCOPIC CHOLECYSTECTOMY WITH INTRAOPERATIVE CHOLANGIOGRAM, POSSIBLE OPEN;  Surgeon: [name], MD;  Location: [facility];  Service: General;  Laterality: N/A;  . Foot surgery      PLANTAR FASCIITIS    Family History  Problem Relation Age of Onset  . Cancer Father     bladder    Social History:  reports that she has been smoking.  She has never used smokeless tobacco. She reports that she drinks alcohol. She reports that she does not use illicit drugs.  Allergies:  Allergies  Allergen Reactions  . Tramadol Itching  . Amoxicillin Rash    Has patient had a PCN reaction causing immediate rash, facial/tongue/throat swelling, SOB or lightheadedness with hypotension: No Has patient had a PCN reaction causing severe rash involving mucus membranes or skin necrosis:  No Has patient had a PCN reaction that required hospitalization No Has patient had a PCN reaction occurring within the last 10 years: No If all of the above answers are "NO", then may proceed with Cephalosporin use.     No prescriptions prior to admission    Review of Systems  Gastrointestinal: Negative for abdominal pain.    There were no vitals taken for this visit. Physical Exam  Constitutional: She is oriented to person, place, and time. She appears well-developed and well-nourished.  Cardiovascular: Normal rate and regular rhythm.   Respiratory: Effort normal.  GI: Soft.  Genitourinary: Vagina normal and uterus normal.  Neurological: She is alert and oriented to person, place, and time.  Psychiatric: She has a normal mood and affect.    No results found for this or any previous visit (from the past 24 hour(s)).  No results found.  Assessment/Plan: The patient was counseled on the novasure procedure in detail. The risks of bleeding and infection and possible uterine perforation were reviewed. We discussed that the procedure will usually reduce bleeding significantly, but may not eliminate periods. We also discussed that she should not perform this procedure if she desires any future pregnancies. She plans no future childbearing and has a h/o infertility. The pt would also like to proceed with  a laparoscopic LSO to remove the ovarian mass. I d/w pt the laparoscopy procedure in detail. We discussed risks and benefits of procedure including bleeding, infection and possible damage to bowel and bladder. We discussed the need for an open incision if any complication arose, and a delay in her recovery with possible longer hospitalization. We discussed ovarian cystectomy vs LSO and I recommend LSO given her age and that lesion is solid and not 100% defined. Pt desires to proceed.    Logan Bores 06/14/2015, 1:42 PM

## 2015-06-15 ENCOUNTER — Encounter (HOSPITAL_COMMUNITY): Payer: Self-pay | Admitting: Anesthesiology

## 2015-06-15 ENCOUNTER — Ambulatory Visit (HOSPITAL_COMMUNITY)
Admission: RE | Admit: 2015-06-15 | Discharge: 2015-06-15 | Disposition: A | Discharge status: Home / Self Care | Payer: BLUE CROSS/BLUE SHIELD | Source: Ambulatory Visit | Attending: Obstetrics and Gynecology | Admitting: Obstetrics and Gynecology

## 2015-06-15 ENCOUNTER — Encounter (HOSPITAL_COMMUNITY)
Admission: RE | Disposition: A | Discharge status: Home / Self Care | Payer: Self-pay | Source: Ambulatory Visit | Attending: Obstetrics and Gynecology

## 2015-06-15 ENCOUNTER — Ambulatory Visit (HOSPITAL_COMMUNITY): Payer: BLUE CROSS/BLUE SHIELD | Admitting: Anesthesiology

## 2015-06-15 DIAGNOSIS — N920 Excessive and frequent menstruation with regular cycle: Secondary | ICD-10-CM | POA: Insufficient documentation

## 2015-06-15 DIAGNOSIS — Z881 Allergy status to other antibiotic agents status: Secondary | ICD-10-CM | POA: Insufficient documentation

## 2015-06-15 DIAGNOSIS — N8302 Follicular cyst of left ovary: Secondary | ICD-10-CM | POA: Insufficient documentation

## 2015-06-15 HISTORY — PX: NOVASURE ABLATION: SHX5394

## 2015-06-15 HISTORY — PX: LAPAROSCOPIC SALPINGO OOPHERECTOMY: SHX5927

## 2015-06-15 SURGERY — SALPINGO-OOPHORECTOMY, LAPAROSCOPIC
Anesthesia: General | Site: Vagina

## 2015-06-15 MED ORDER — FENTANYL CITRATE (PF) 250 MCG/5ML IJ SOLN
INTRAMUSCULAR | Status: AC
  Filled 2015-06-15: qty 5

## 2015-06-15 MED ORDER — FENTANYL CITRATE (PF) 100 MCG/2ML IJ SOLN
INTRAMUSCULAR | Status: AC
  Filled 2015-06-15: qty 2

## 2015-06-15 MED ORDER — PROPOFOL 10 MG/ML IV BOLUS
INTRAVENOUS | Status: DC | PRN
  Administered 2015-06-15: 180 mg via INTRAVENOUS

## 2015-06-15 MED ORDER — MIDAZOLAM HCL 2 MG/2ML IJ SOLN
INTRAMUSCULAR | Status: DC | PRN
  Administered 2015-06-15: 2 mg via INTRAVENOUS

## 2015-06-15 MED ORDER — DEXAMETHASONE SODIUM PHOSPHATE 10 MG/ML IJ SOLN
INTRAMUSCULAR | Status: DC | PRN
  Administered 2015-06-15: 4 mg via INTRAVENOUS

## 2015-06-15 MED ORDER — KETOROLAC TROMETHAMINE 30 MG/ML IJ SOLN
INTRAMUSCULAR | Status: AC
  Filled 2015-06-15: qty 1

## 2015-06-15 MED ORDER — PROPOFOL 10 MG/ML IV BOLUS
INTRAVENOUS | Status: AC
  Filled 2015-06-15: qty 20

## 2015-06-15 MED ORDER — BUPIVACAINE HCL (PF) 0.25 % IJ SOLN
INTRAMUSCULAR | Status: DC | PRN
  Administered 2015-06-15: 9 mL

## 2015-06-15 MED ORDER — ONDANSETRON HCL 4 MG/2ML IJ SOLN
4.0000 mg | Freq: Once | INTRAMUSCULAR | Status: AC
  Administered 2015-06-15: 4 mg via INTRAVENOUS

## 2015-06-15 MED ORDER — METOCLOPRAMIDE HCL 5 MG/ML IJ SOLN
INTRAMUSCULAR | Status: AC
  Filled 2015-06-15: qty 2

## 2015-06-15 MED ORDER — LACTATED RINGERS IV SOLN
INTRAVENOUS | Status: DC
  Administered 2015-06-15 (×2): via INTRAVENOUS

## 2015-06-15 MED ORDER — FENTANYL CITRATE (PF) 100 MCG/2ML IJ SOLN
INTRAMUSCULAR | Status: DC | PRN
  Administered 2015-06-15 (×3): 50 ug via INTRAVENOUS
  Administered 2015-06-15: 100 ug via INTRAVENOUS

## 2015-06-15 MED ORDER — NEOSTIGMINE METHYLSULFATE 10 MG/10ML IV SOLN
INTRAVENOUS | Status: AC
  Filled 2015-06-15: qty 1

## 2015-06-15 MED ORDER — KETOROLAC TROMETHAMINE 30 MG/ML IJ SOLN
INTRAMUSCULAR | Status: DC | PRN
  Administered 2015-06-15: 30 mg via INTRAVENOUS

## 2015-06-15 MED ORDER — MEPERIDINE HCL 25 MG/ML IJ SOLN: 6.2500 mg | INTRAMUSCULAR | Status: DC | PRN

## 2015-06-15 MED ORDER — SCOPOLAMINE 1 MG/3DAYS TD PT72
1.0000 | MEDICATED_PATCH | Freq: Once | TRANSDERMAL | Status: DC
  Administered 2015-06-15: 1.5 mg via TRANSDERMAL

## 2015-06-15 MED ORDER — GLYCOPYRROLATE 0.2 MG/ML IJ SOLN
INTRAMUSCULAR | Status: AC
  Filled 2015-06-15: qty 3

## 2015-06-15 MED ORDER — SCOPOLAMINE 1 MG/3DAYS TD PT72
MEDICATED_PATCH | TRANSDERMAL | Status: AC
  Administered 2015-06-15: 1.5 mg via TRANSDERMAL
  Filled 2015-06-15: qty 1

## 2015-06-15 MED ORDER — FENTANYL CITRATE (PF) 100 MCG/2ML IJ SOLN
25.0000 ug | INTRAMUSCULAR | Status: DC | PRN
  Administered 2015-06-15 (×3): 50 ug via INTRAVENOUS

## 2015-06-15 MED ORDER — LACTATED RINGERS IV SOLN
INTRAVENOUS | Status: DC
  Administered 2015-06-15: 13:00:00 via INTRAVENOUS

## 2015-06-15 MED ORDER — LACTATED RINGERS IR SOLN
Status: DC | PRN
  Administered 2015-06-15: 1000 mL

## 2015-06-15 MED ORDER — BUPIVACAINE HCL (PF) 0.25 % IJ SOLN
INTRAMUSCULAR | Status: AC
  Filled 2015-06-15: qty 30

## 2015-06-15 MED ORDER — GLYCOPYRROLATE 0.2 MG/ML IJ SOLN
INTRAMUSCULAR | Status: DC | PRN
  Administered 2015-06-15: 0.4 mg via INTRAVENOUS

## 2015-06-15 MED ORDER — DEXAMETHASONE SODIUM PHOSPHATE 4 MG/ML IJ SOLN
INTRAMUSCULAR | Status: AC
  Filled 2015-06-15: qty 1

## 2015-06-15 MED ORDER — ONDANSETRON HCL 4 MG/2ML IJ SOLN
INTRAMUSCULAR | Status: DC | PRN
  Administered 2015-06-15: 4 mg via INTRAVENOUS

## 2015-06-15 MED ORDER — MIDAZOLAM HCL 2 MG/2ML IJ SOLN
INTRAMUSCULAR | Status: AC
  Filled 2015-06-15: qty 2

## 2015-06-15 MED ORDER — LIDOCAINE HCL 1 % IJ SOLN
INTRAMUSCULAR | Status: AC
  Filled 2015-06-15: qty 20

## 2015-06-15 MED ORDER — LIDOCAINE HCL (PF) 1 % IJ SOLN
INTRAMUSCULAR | Status: AC
  Filled 2015-06-15: qty 5

## 2015-06-15 MED ORDER — LIDOCAINE HCL (CARDIAC) 20 MG/ML IV SOLN
INTRAVENOUS | Status: DC | PRN
  Administered 2015-06-15: 50 mg via INTRAVENOUS

## 2015-06-15 MED ORDER — ONDANSETRON HCL 4 MG/2ML IJ SOLN
INTRAMUSCULAR | Status: AC
  Filled 2015-06-15: qty 2

## 2015-06-15 MED ORDER — ROCURONIUM BROMIDE 100 MG/10ML IV SOLN
INTRAVENOUS | Status: DC | PRN
  Administered 2015-06-15: 40 mg via INTRAVENOUS

## 2015-06-15 MED ORDER — NEOSTIGMINE METHYLSULFATE 10 MG/10ML IV SOLN
INTRAVENOUS | Status: DC | PRN
  Administered 2015-06-15: 3 mg via INTRAVENOUS

## 2015-06-15 MED ORDER — IBUPROFEN 600 MG PO TABS: 600.0000 mg | ORAL_TABLET | Freq: Four times a day (QID) | ORAL | Status: DC | PRN

## 2015-06-15 MED ORDER — LIDOCAINE HCL 1 % IJ SOLN
INTRAMUSCULAR | Status: DC | PRN
Start: 2015-06-15 — End: 2015-06-15
  Administered 2015-06-15: 20 mL

## 2015-06-15 MED ORDER — METOCLOPRAMIDE HCL 5 MG/ML IJ SOLN
10.0000 mg | Freq: Once | INTRAMUSCULAR | Status: AC | PRN
  Administered 2015-06-15: 10 mg via INTRAVENOUS

## 2015-06-15 MED ORDER — HYDROCODONE-ACETAMINOPHEN 7.5-325 MG PO TABS: 1.0000 | ORAL_TABLET | Freq: Once | ORAL | Status: DC | PRN

## 2015-06-15 SURGICAL SUPPLY — 34 items
ABLATOR ENDOMETRIAL BIPOLAR (ABLATOR) ×2 IMPLANT
BAG SPEC RTRVL LRG 6X4 10 (ENDOMECHANICALS) ×2
CABLE HIGH FREQUENCY MONO STRZ (ELECTRODE) IMPLANT
CANISTER SUCT 3000ML (MISCELLANEOUS) ×4 IMPLANT
CATH ROBINSON RED A/P 16FR (CATHETERS) ×4 IMPLANT
CHLORAPREP W/TINT 26ML (MISCELLANEOUS) ×4 IMPLANT
CLOTH BEACON ORANGE TIMEOUT ST (SAFETY) ×4 IMPLANT
CONTAINER PREFILL 10% NBF 60ML (FORM) ×8 IMPLANT
DECANTER SPIKE VIAL GLASS SM (MISCELLANEOUS) ×4 IMPLANT
DRSG COVADERM PLUS 2X2 (GAUZE/BANDAGES/DRESSINGS) ×8 IMPLANT
DRSG OPSITE POSTOP 3X4 (GAUZE/BANDAGES/DRESSINGS) ×2 IMPLANT
GLOVE BIO SURGEON STRL SZ 6.5 (GLOVE) ×3 IMPLANT
GLOVE BIO SURGEON STRL SZ8 (GLOVE) ×4 IMPLANT
GLOVE BIO SURGEONS STRL SZ 6.5 (GLOVE) ×1
GLOVE BIOGEL PI IND STRL 7.0 (GLOVE) ×2 IMPLANT
GLOVE BIOGEL PI INDICATOR 7.0 (GLOVE) ×2
GOWN STRL REUS W/TWL LRG LVL3 (GOWN DISPOSABLE) ×8 IMPLANT
LIQUID BAND (GAUZE/BANDAGES/DRESSINGS) ×4 IMPLANT
NEEDLE INSUFFLATION 120MM (ENDOMECHANICALS) ×4 IMPLANT
NS IRRIG 1000ML POUR BTL (IV SOLUTION) ×4 IMPLANT
PACK LAPAROSCOPY BASIN (CUSTOM PROCEDURE TRAY) ×4 IMPLANT
PACK VAGINAL MINOR WOMEN LF (CUSTOM PROCEDURE TRAY) ×4 IMPLANT
POUCH SPECIMEN RETRIEVAL 10MM (ENDOMECHANICALS) ×2 IMPLANT
SET IRRIG TUBING LAPAROSCOPIC (IRRIGATION / IRRIGATOR) IMPLANT
SHEARS HARMONIC ACE PLUS 36CM (ENDOMECHANICALS) ×2 IMPLANT
SLEEVE XCEL OPT CAN 5 100 (ENDOMECHANICALS) ×4 IMPLANT
SOLUTION ELECTROLUBE (MISCELLANEOUS) IMPLANT
SUT VIC AB 3-0 PS2 18 (SUTURE) ×8
SUT VIC AB 3-0 PS2 18XBRD (SUTURE) ×2 IMPLANT
SUT VICRYL 0 UR6 27IN ABS (SUTURE) ×4 IMPLANT
TOWEL OR 17X24 6PK STRL BLUE (TOWEL DISPOSABLE) ×8 IMPLANT
TROCAR XCEL NON-BLD 11X100MML (ENDOMECHANICALS) IMPLANT
WARMER LAPAROSCOPE (MISCELLANEOUS) ×4 IMPLANT
WATER STERILE IRR 1000ML POUR (IV SOLUTION) ×4 IMPLANT

## 2015-06-15 NOTE — Transfer of Care (Signed)
Immediate Anesthesia Transfer of Care Note  Patient: Bethany Johns  Procedure(s) Performed: Procedure(s): LAPAROSCOPIC LEFT SALPINGO OOPHORECTOMY (Left) NOVASURE ABLATION (N/A)  Patient Location: PACU  Anesthesia Type:General  Level of Consciousness: awake, oriented and sedated  Airway & Oxygen Therapy: Patient Spontanous Breathing and Patient connected to nasal cannula oxygen  Post-op Assessment: Report given to RN and Post -op Vital signs reviewed and stable  Post vital signs: Reviewed and stable  Last Vitals:  Filed Vitals:   06/15/15 0705  BP: 125/87  Pulse: 72  Temp: 36.8 C  Resp: 20    Complications: No apparent anesthesia complications

## 2015-06-15 NOTE — Progress Notes (Signed)
Patient ID: Bethany Johns, female   DOB: [birth year], 49 y.o.   MRN: [id number] Pt doing well.  No changes in dictated H&P and brief exam WNL.  Ready to proceed.

## 2015-06-15 NOTE — Anesthesia Preprocedure Evaluation (Addendum)
Anesthesia Evaluation  Patient identified by MRN, date of birth, ID band Patient awake    Reviewed: Allergy & Precautions, H&P , NPO status , Patient's Chart, lab work & pertinent test results, reviewed documented beta blocker date and time   Airway Mallampati: I  TM Distance: >3 FB Neck ROM: full    Dental no notable dental hx. (+) Teeth Intact   Pulmonary pneumonia, resolved, Current Smoker,    Pulmonary exam normal        Cardiovascular negative cardio ROS Normal cardiovascular exam     Neuro/Psych negative neurological ROS  negative psych ROS   GI/Hepatic negative GI ROS, Neg liver ROS,   Endo/Other  negative endocrine ROSObesity  Renal/GU negative Renal ROS  negative genitourinary   Musculoskeletal  (+) Arthritis , Osteoarthritis,  Both knees Hx/o LBP   Abdominal (+) + obese,   Peds  Hematology negative hematology ROS (+)   Anesthesia Other Findings   Reproductive/Obstetrics negative OB ROS                           Anesthesia Physical Anesthesia Plan  ASA: II  Anesthesia Plan: General   Post-op Pain Management:    Induction: Intravenous  Airway Management Planned: Oral ETT  Additional Equipment:   Intra-op Plan:   Post-operative Plan: Extubation in OR  Informed Consent: I have reviewed the patients History and Physical, chart, labs and discussed the procedure including the risks, benefits and alternatives for the proposed anesthesia with the patient or authorized representative who has indicated his/her understanding and acceptance.   Dental advisory given  Plan Discussed with: CRNA and Surgeon  Anesthesia Plan Comments:       Anesthesia Quick Evaluation

## 2015-06-15 NOTE — Op Note (Signed)
Operative Note    Preoperative Diagnosis Left ovarian mass Menorrhagia  Postoperative Diagnosis same  Procedure Operative laparoscopy with left salpingoophorectomy Hysteroscopy/D&C/Novasure ablation   Surgeon Paula Compton [facility]  Anesthesia General  Fluids: EBL 50cc UOP 25cc straight cath prior to procedure IVF 1200ccLR Hysteroscopic deficit 100cc LR  Findings Left ovary enlarged with cystic area and smaller solid appearing lesion.  Right ovary WNL.  Uterus WNL Pelvis and abdomen unremarkable Normal uterine cavity  Specimen Left ovary and tube Endometrial currettings   Procedure  Patient was taken to the operating room where general anesthesia was obtained without difficulty. She was then prepped and draped in the normal sterile fashion in the dorsal lithotomy position. An appropriate timeout was performed. A speculum was then placed within the vagina and a Hulka tenaculum placed within the cervix for uterine manipulation. The bladder was emptied. Attention was then turned to the patient's abdomen after draping where the infraumbilical area was injected with approximately 10 cc of quarter percent Marcaine. A 2 cm incision was then made below the umbilicus and the fascia identified and grasped with alyss clamps and entered sharply.  Intraperitoneal placement was confirmed by direct visualization and the [facility] trochar placed in the incision. The fascia was secured with a  Pursestring suture of 0 vicryl.  Gas flow was then applied and a pneumoperitoneum obtained with approximate 3 L of CO2 gas. With patient in Trendelenburg the uterus and tubes and ovaries were inspected with findings as previously stated. Two additional 5 mm trochars were then placed in the upper lateral quadrants under direct visualization.  The left adnexa was grasped and retracted medially and the Harmonic scapel used to transect the infundilbulopelvic ligament, broad ligament and uteroovarian  ligament sequentially.  The adnexa was then free and placed in the cul-de-sac.  The camera was then changed to a 97mm scope and an endobag placed through the [place].  The left tube and ovary were placed in the bag and removed with the [facility] and handed to pathology.  The remainder of the pelvis and abdomen were inspected and found to be hemostatic.  A 4 quadrant look revealed no bleeding or injuries.  The left ureter was visualized and well below the operative field. The umbilical port was close with the previosly placed 0-vicryl pursestring suture and one additional figure of eight vicryl suture.   The instruments were removed from the abdomen and the pneumoperitoneum reduced through the trocar. A subcuticular stitch of 3-0 Vicryl was used to close the skin on all port sites.   Dermabond and a bandage were placed. Attention was then turned to the vagina where a speculum was then placed within the vagina and the anterior lip of the cervix identified and injected with approximately 2 cc of 1% plain lidocaine. An additional 9 cc each was placed at 2 and 10:00 for a paracervical block. Uterus was then sounded to 8 cm and the cervical length measured at 3 cm.  The Pratt dilators utilized to dilate the cervix up to approximately 24.  A gentle currettege was performed.  The hysterosscope was introduced into the cavity and the findings noted as previously stated.  The hysteroscope was then removed and the Novasure device inserted to the top of the fundus and deployed.  A cavity width of 3.7 noted.  The device was activated with a treatment time of 78 secs. The hysteroscope was then replaced and the cavity noted to have a good treatment effect with blanching and no viable endometrium  apparent.  All instruments were removed from the vagina.  The tenaculum site was hemostatic.  Finally the speculum was removed from the vagina and the patient awakened and taken to the recovery room in good condition.

## 2015-06-15 NOTE — Discharge Instructions (Signed)
DISCHARGE INSTRUCTIONS: Laparoscopy  The following instructions have been prepared to help you care for yourself upon your return home today.  Wound care:  Do not get the incision wet for the first 24 hours. The incision should be kept clean and dry.  The Band-Aids or dressings may be removed the day after surgery.  Should the incision become sore, red, and swollen after the first week, check with your doctor.  Personal hygiene:  Shower the day after your procedure.  Activity and limitations:  Do NOT drive or operate any equipment today.  Do NOT lift anything more than 15 pounds for 2-3 weeks after surgery.  Do NOT rest in bed all day.  Walking is encouraged. Walk each day, starting slowly with 5-minute walks 3 or 4 times a day. Slowly increase the length of your walks.  Walk up and down stairs slowly.  Do NOT do strenuous activities, such as golfing, playing tennis, bowling, running, biking, weight lifting, gardening, mowing, or vacuuming for 2-4 weeks. Ask your doctor when it is okay to start.  Diet: Eat a light meal as desired this evening. You may resume your usual diet tomorrow.  Return to work: This is dependent on the type of work you do. For the most part you can return to a desk job within a week of surgery. If you are more active at work, please discuss this with your doctor.  What to expect after your surgery: You may have a slight burning sensation when you urinate on the first day. You may have a very small amount of blood in the urine. Expect to have a small amount of vaginal discharge/light bleeding for 1-2 weeks. It is not unusual to have abdominal soreness and bruising for up to 2 weeks. You may be tired and need more rest for about 1 week. You may experience shoulder pain for 24-72 hours. Lying flat in bed may relieve it.  Call your doctor for any of the following:  Develop a fever of 100.4 or greater  Inability to urinate 6 hours after discharge from  hospital  Severe pain not relieved by pain medications  Persistent of heavy bleeding at incision site  Redness or swelling around incision site after a week  Increasing nausea or vomiting  Patient Signature________________________________________ Nurse Signature_________________________________________DISCHARGE INSTRUCTIONS: HYSTEROSCOPY / ENDOMETRIAL ABLATION The following instructions have been prepared to help you care for yourself upon your return home.  May Remove Scop patch on or before  May take Ibuprofen after  May take stool softner while taking narcotic pain medication to prevent constipation.  Drink plenty of water.  Personal hygiene:  Use sanitary pads for vaginal drainage, not tampons.  Shower the day after your procedure.  NO tub baths, pools or Jacuzzis for 2-3 weeks.  Wipe front to back after using the bathroom.  Activity and limitations:  Do NOT drive or operate any equipment for 24 hours. The effects of anesthesia are still present and drowsiness may result.  Do NOT rest in bed all day.  Walking is encouraged.  Walk up and down stairs slowly.  You may resume your normal activity in one to two days or as indicated by your physician. Sexual activity: NO intercourse for at least 2 weeks after the procedure, or as indicated by your Doctor.  Diet: Eat a light meal as desired this evening. You may resume your usual diet tomorrow.  Return to Work: You may resume your work activities in one to two days or as indicated  by your Doctor.  What to expect after your surgery: Expect to have vaginal bleeding/discharge for 2-3 days and spotting for up to 10 days. It is not unusual to have soreness for up to 1-2 weeks. You may have a slight burning sensation when you urinate for the first day. Mild cramps may continue for a couple of days. You may have a regular period in 2-6 weeks.  Call your doctor for any of the following:  Excessive vaginal bleeding or  clotting, saturating and changing one pad every hour.  Inability to urinate 6 hours after discharge from hospital.  Pain not relieved by pain medication.  Fever of 100.4 F or greater.  Unusual vaginal discharge or odor.  Return to office _________________Call for an appointment ___________________ Patients signature: ______________________ Nurses signature ________________________  Post Anesthesia Care Unit [phone] Post Anesthesia Home Care Instructions  Activity: Get plenty of rest for the remainder of the day. A responsible adult should stay with you for 24 hours following the procedure.  For the next 24 hours, DO NOT: -Drive a car -Paediatric nurse -Drink alcoholic beverages -Take any medication unless instructed by your physician -Make any legal decisions or sign important papers.  Meals: Start with liquid foods such as gelatin or soup. Progress to regular foods as tolerated. Avoid greasy, spicy, heavy foods. If nausea and/or vomiting occur, drink only clear liquids until the nausea and/or vomiting subsides. Call your physician if vomiting continues.  Special Instructions/Symptoms: Your throat may feel dry or sore from the anesthesia or the breathing tube placed in your throat during surgery. If this causes discomfort, gargle with warm salt water. The discomfort should disappear within 24 hours.  If you had a scopolamine patch placed behind your ear for the management of post- operative nausea and/or vomiting:  1. The medication in the patch is effective for 72 hours, after which it should be removed.  Wrap patch in a tissue and discard in the trash. Wash hands thoroughly with soap and water. 2. You may remove the patch earlier than 72 hours if you experience unpleasant side effects which may include dry mouth, dizziness or visual disturbances. 3. Avoid touching the patch. Wash your hands with soap and water after contact with the patch.

## 2015-06-15 NOTE — Anesthesia Procedure Notes (Signed)
Procedure Name: Intubation Date/Time: 06/15/2015 8:38 AM Performed by: Bufford Spikes Pre-anesthesia Checklist: Patient identified, Timeout performed, Emergency Drugs available, Suction available and Patient being monitored Patient Re-evaluated:Patient Re-evaluated prior to inductionOxygen Delivery Method: Circle system utilized Preoxygenation: Pre-oxygenation with 100% oxygen Intubation Type: IV induction Ventilation: Mask ventilation without difficulty Laryngoscope Size: Miller and 2 Grade View: Grade I Tube type: Oral Tube size: 7.0 mm Number of attempts: 1 Airway Equipment and Method: Stylet Placement Confirmation: ETT inserted through vocal cords under direct vision,  positive ETCO2 and breath sounds checked- equal and bilateral Secured at: 21 cm Tube secured with: Tape Dental Injury: Teeth and Oropharynx as per pre-operative assessment

## 2015-06-15 NOTE — Anesthesia Postprocedure Evaluation (Signed)
Anesthesia Post Note  Patient: Bethany Johns  Procedure(s) Performed: Procedure(s) (LRB): LAPAROSCOPIC LEFT SALPINGO OOPHORECTOMY (Left) NOVASURE ABLATION (N/A)  Patient location during evaluation: PACU Anesthesia Type: General Level of consciousness: awake Pain management: pain level controlled Vital Signs Assessment: post-procedure vital signs reviewed and stable Respiratory status: spontaneous breathing Cardiovascular status: stable Postop Assessment: no signs of nausea or vomiting Anesthetic complications: no    Last Vitals:  Filed Vitals:   06/15/15 1115 06/15/15 1130  BP: 117/72 128/82  Pulse: 70 81  Temp:    Resp: 16 19    Last Pain:  Filed Vitals:   06/15/15 1238  PainSc: 7                  [name] JR,JOHN Jayquon Theiler

## 2015-06-16 ENCOUNTER — Encounter (HOSPITAL_COMMUNITY): Payer: Self-pay | Admitting: Obstetrics and Gynecology

## 2015-11-17 ENCOUNTER — Ambulatory Visit (INDEPENDENT_AMBULATORY_CARE_PROVIDER_SITE_OTHER): Payer: BLUE CROSS/BLUE SHIELD | Admitting: Podiatry

## 2015-11-17 ENCOUNTER — Encounter: Payer: Self-pay | Admitting: Podiatry

## 2015-11-17 ENCOUNTER — Ambulatory Visit (INDEPENDENT_AMBULATORY_CARE_PROVIDER_SITE_OTHER): Payer: BLUE CROSS/BLUE SHIELD

## 2015-11-17 VITALS — BP 124/83 | HR 82 | Resp 16

## 2015-11-17 DIAGNOSIS — M79671 Pain in right foot: Secondary | ICD-10-CM

## 2015-11-17 DIAGNOSIS — M779 Enthesopathy, unspecified: Secondary | ICD-10-CM | POA: Diagnosis not present

## 2015-11-17 DIAGNOSIS — M79672 Pain in left foot: Secondary | ICD-10-CM | POA: Diagnosis not present

## 2015-11-17 DIAGNOSIS — R600 Localized edema: Secondary | ICD-10-CM

## 2015-11-17 MED ORDER — TRIAMCINOLONE ACETONIDE 10 MG/ML IJ SUSP
10.0000 mg | Freq: Once | INTRAMUSCULAR | Status: AC
  Administered 2015-11-17: 10 mg

## 2015-11-17 MED ORDER — PREDNISONE 10 MG PO TABS: ORAL_TABLET | ORAL | Status: DC

## 2015-11-17 NOTE — Progress Notes (Signed)
Subjective:     Patient ID: Bethany Johns, female   DOB: 1966-03-31, 50 y.o.   MRN: [id number]  HPI patient states she was doing great with her heels but then she started in the last month or 2 develop some pain in her forefoot bilateral. Does not remember specific injury and it's been approximate 9 months since we had done the surgery on her heels   Review of Systems     Objective:   Physical Exam Neurovascular status intact muscle strength adequate patient found to have pain that's most intense around the fourth metatarsal phalangeal joint bilateral with inflammation within the joint surface and discomfort when I squeezed both feet. There is moderate edema in the ankle region bilateral that is nondescript and negative Homans sign bilaterally    Assessment:     Inflammatory capsulitis fourth MPJ bilateral with possibility for neuroma symptoms or other pathology with edema of the mid foot and ankle bilateral    Plan:     H&P and x-rays reviewed. I did forefoot blocks bilateral of 60 Milligan times like Marcaine mixture aspirated the fourth MPJ getting out of a small amount of clear fluid and injected with half cc of dextran some Kenalog to reduce inflammation of the joint surface. I then applied anklet's to both feet to try to reduce edema and reappoint in the next several weeks  X-ray report negative for signs of fracture or loss of arch height with no indications of arthritis

## 2015-12-01 ENCOUNTER — Encounter: Payer: Self-pay | Admitting: Podiatry

## 2015-12-01 ENCOUNTER — Ambulatory Visit (INDEPENDENT_AMBULATORY_CARE_PROVIDER_SITE_OTHER): Payer: BLUE CROSS/BLUE SHIELD | Admitting: Podiatry

## 2015-12-01 DIAGNOSIS — M779 Enthesopathy, unspecified: Secondary | ICD-10-CM

## 2015-12-02 NOTE — Progress Notes (Signed)
Subjective:     Patient ID: Bethany Johns, female   DOB: Jun 09, 1966, 50 y.o.   MRN: [id number]  HPI patient presents stating that the pain has improved by about 80% with pain still noted upon palpation   Review of Systems     Objective:   Physical Exam Neurovascular status intact muscle strength adequate with inflammation of the capsular tissue bilateral that has improved by about 70%    Assessment:     Improved capsulitis with pain still present    Plan:     Pads were applied to take pressure off the area and patient will be seen back to recheck

## 2016-02-03 ENCOUNTER — Ambulatory Visit (INDEPENDENT_AMBULATORY_CARE_PROVIDER_SITE_OTHER): Payer: BLUE CROSS/BLUE SHIELD | Admitting: Podiatry

## 2016-02-03 ENCOUNTER — Telehealth: Payer: Self-pay | Admitting: *Deleted

## 2016-02-03 ENCOUNTER — Encounter: Payer: Self-pay | Admitting: Podiatry

## 2016-02-03 VITALS — BP 112/72 | HR 81 | Resp 16

## 2016-02-03 DIAGNOSIS — G5761 Lesion of plantar nerve, right lower limb: Secondary | ICD-10-CM | POA: Diagnosis not present

## 2016-02-03 DIAGNOSIS — D3610 Benign neoplasm of peripheral nerves and autonomic nervous system, unspecified: Secondary | ICD-10-CM

## 2016-02-03 DIAGNOSIS — G5762 Lesion of plantar nerve, left lower limb: Secondary | ICD-10-CM

## 2016-02-03 DIAGNOSIS — M779 Enthesopathy, unspecified: Secondary | ICD-10-CM

## 2016-02-03 NOTE — Telephone Encounter (Signed)
Pt states she would like someone to go over her 10/2015 x-rays with her she has questions.  I spoke with pt, she states her chiropractor asked if her x-rays showed arthritis.  I reviewed the x-ray note in Dr.Regal's 11/17/2015 note with pt. Pt states she is continuing to have pain in her feet even after the fluid was removed.  I told pt she should make an appt to discuss with Dr. Paulla Dolly and bring the orthotics for possible modification. Pt states understanding and I transferred to schedulers.

## 2016-02-07 NOTE — Progress Notes (Signed)
Subjective:     Patient ID: Timiko Gau, female   DOB: 1966-01-09, 50 y.o.   MRN: [id number]  HPI patient states she has developed pain on top of her foot but it is not as bad as it and it is slightly more lateral than previously   Review of Systems     Objective:   Physical Exam Neurovascular status intact with continued discomfort dorsum of both feet that's localized with no increased inflammation or tendinitis    Assessment:     Continue tendinitis dorsal bilateral    Plan:     Patient's heels are doing beautifully and she's extremely happy with this and she does have dorsal pain for which injections do keep under control which was accomplished today with dexamethasone Kenalog and Xylocaine. She'll be seen back as needed for conservative care

## 2016-02-20 ENCOUNTER — Encounter: Payer: Self-pay | Admitting: Podiatry

## 2016-02-20 ENCOUNTER — Ambulatory Visit (INDEPENDENT_AMBULATORY_CARE_PROVIDER_SITE_OTHER): Payer: BLUE CROSS/BLUE SHIELD | Admitting: Podiatry

## 2016-02-20 DIAGNOSIS — M779 Enthesopathy, unspecified: Secondary | ICD-10-CM

## 2016-02-20 DIAGNOSIS — D3610 Benign neoplasm of peripheral nerves and autonomic nervous system, unspecified: Secondary | ICD-10-CM

## 2016-02-20 DIAGNOSIS — G5762 Lesion of plantar nerve, left lower limb: Secondary | ICD-10-CM | POA: Diagnosis not present

## 2016-02-22 NOTE — Progress Notes (Signed)
Subjective:     Patient ID: Bethany Johns, female   DOB: Nov 28, 1965, 50 y.o.   MRN: [id number]  HPI patient presents stating this seems to be helping but I am getting some shooting pain in the forefoot bilateral   Review of Systems     Objective:   Physical Exam Neurovascular status intact muscle strength adequate with continued neuroma-like symptoms third interspace of both feet with radiating discomfort into the adjacent digits    Assessment:     Neuroma symptomatology bilateral    Plan:     Conservative care to be continued and I did inject the nerve with a purified alcohol Marcaine solution which was tolerated well

## 2016-03-12 ENCOUNTER — Encounter: Payer: Self-pay | Admitting: Podiatry

## 2016-03-12 ENCOUNTER — Ambulatory Visit (INDEPENDENT_AMBULATORY_CARE_PROVIDER_SITE_OTHER): Payer: BLUE CROSS/BLUE SHIELD | Admitting: Podiatry

## 2016-03-12 DIAGNOSIS — D3610 Benign neoplasm of peripheral nerves and autonomic nervous system, unspecified: Secondary | ICD-10-CM | POA: Diagnosis not present

## 2016-03-12 DIAGNOSIS — M779 Enthesopathy, unspecified: Secondary | ICD-10-CM | POA: Diagnosis not present

## 2016-03-12 DIAGNOSIS — G629 Polyneuropathy, unspecified: Secondary | ICD-10-CM

## 2016-03-12 MED ORDER — GABAPENTIN 400 MG PO CAPS: 400.0000 mg | ORAL_CAPSULE | Freq: Three times a day (TID) | ORAL | 3 refills | Status: DC

## 2016-03-14 NOTE — Progress Notes (Signed)
Subjective:     Patient ID: Bethany Johns, female   DOB: [birth year], 50 y.o.   MRN: [id number]  HPI patient states the area we worked on seems somewhat better but she is having problems with the top of her feet. States she does not remember specific injury   Review of Systems     Objective:   Physical Exam Neurovascular status intact negative Homans was noted with well-healed plantar site heel region bilateral with inflammation more in the dorsum of the feet nondescript in nature    Assessment:     May be some kind of nerve compression or other neuropathic condition    Plan:     We are going to try gabapentin to see if this will make a difference and she will start one at night and if she tolerates it well 1 morning 1 mid afternoon to try to reduce symptoms and continue ice therapy anti-inflammatories

## 2016-04-19 ENCOUNTER — Ambulatory Visit (INDEPENDENT_AMBULATORY_CARE_PROVIDER_SITE_OTHER): Payer: BLUE CROSS/BLUE SHIELD | Admitting: Podiatry

## 2016-04-19 ENCOUNTER — Encounter: Payer: Self-pay | Admitting: Podiatry

## 2016-04-19 DIAGNOSIS — G629 Polyneuropathy, unspecified: Secondary | ICD-10-CM | POA: Diagnosis not present

## 2016-04-19 DIAGNOSIS — M79671 Pain in right foot: Secondary | ICD-10-CM | POA: Diagnosis not present

## 2016-04-19 DIAGNOSIS — M79672 Pain in left foot: Secondary | ICD-10-CM | POA: Diagnosis not present

## 2016-04-19 MED ORDER — PREDNISONE 10 MG PO TABS: ORAL_TABLET | ORAL | 0 refills | Status: DC

## 2016-04-29 NOTE — Progress Notes (Signed)
Subjective:     Patient ID: Parneet Chavana, female   DOB: 1966/02/16, 50 y.o.   MRN: [id number]  HPI patient states that she still is having discomfort in both feet but it's very difficult big exactly explaining where it sat and it appears to be somewhat all over the foot. The heels that we operated on her doing very well and she admits that she did not take her gabapentin as we had wanted her to and that she is just frustrated by continued discomfort   Review of Systems     Objective:   Physical Exam Neurovascular status was found to be intact with no significant changes and wound edges well coapted in the medial and lateral heel bilateral with no indications of pathology. Patient is found to have no area that I was able to specifically palpate discomfort currently but it just seems to be something that is aggravating for her with prolonged ambulation    Assessment:     No indications currently of any chronic pain syndrome except for pain out of proportion with no skin changes noted no mottled appearance or other pathology with possibility of some kind of low-grade nerve irritation    Plan:     Encouraged her to take her gabapentin as we had recommended and do heat ice therapy and tried different compression. If symptoms persist we may need to get neurological consult or other treatment but at this time nothing indicates that would allow [place] to see success with

## 2016-05-22 ENCOUNTER — Telehealth: Payer: Self-pay | Admitting: *Deleted

## 2016-05-22 MED ORDER — GABAPENTIN 400 MG PO CAPS: 400.0000 mg | ORAL_CAPSULE | Freq: Three times a day (TID) | ORAL | 1 refills | Status: DC

## 2016-05-22 NOTE — Telephone Encounter (Signed)
Received request for 90 days of Gabapentin 400mg . Dr. Josephina Shih with +1refill. Return fax to [place].

## 2016-05-31 ENCOUNTER — Ambulatory Visit: Payer: BLUE CROSS/BLUE SHIELD | Admitting: Podiatry

## 2016-10-19 ENCOUNTER — Telehealth: Payer: Self-pay | Admitting: *Deleted

## 2016-10-19 NOTE — Telephone Encounter (Signed)
Pt states she would like her records faxed to her orthopedic.

## 2016-10-19 NOTE — Telephone Encounter (Signed)
Called patient back in regards to her request for her records to be sent to her orthopedic doctor. Patient did not answer. Left voicemail message for patient to call me back.

## 2016-12-25 LAB — HM PAP SMEAR: HM Pap smear: NORMAL

## 2017-01-01 ENCOUNTER — Other Ambulatory Visit: Payer: Self-pay | Admitting: Obstetrics and Gynecology

## 2017-01-01 DIAGNOSIS — N6489 Other specified disorders of breast: Secondary | ICD-10-CM

## 2017-01-04 ENCOUNTER — Other Ambulatory Visit: Payer: Self-pay | Admitting: Obstetrics and Gynecology

## 2017-01-04 DIAGNOSIS — N6489 Other specified disorders of breast: Secondary | ICD-10-CM

## 2017-01-10 ENCOUNTER — Other Ambulatory Visit: Payer: Self-pay | Admitting: Obstetrics and Gynecology

## 2017-01-10 ENCOUNTER — Ambulatory Visit
Admission: RE | Admit: 2017-01-10 | Discharge: 2017-01-10 | Disposition: A | Discharge status: Home / Self Care | Payer: BLUE CROSS/BLUE SHIELD | Source: Ambulatory Visit | Attending: Obstetrics and Gynecology | Admitting: Obstetrics and Gynecology

## 2017-01-10 DIAGNOSIS — N6489 Other specified disorders of breast: Secondary | ICD-10-CM

## 2017-02-07 NOTE — Progress Notes (Signed)
Office Visit Note  Patient: Bethany Johns. [name]             Date of Birth: [birth year]           MRN: [id number]             PCP: Elesa Massed, NP Referring: Wylene Simmer, MD Visit Date: 02/11/2017 Occupation: Clerical job    Subjective:  Pain in feet.   History of Present Illness: Bethany Johns. Spradlin is a 51 y.o. female seen in consultation per request of Dr. Doran Durand. According to patient she has had plantar fasciitis for about 20 years. Her symptoms seems to flare off and on and then 4 years ago to become more persistent. She states her podiatrist released her bilateral plantar fascia which was very helpful. 3 months after the surgery she started having pain in the dorsum of her bilateral feet and across her toes. She started experiencing some burning sensation in her feet very and her podiatrist placed on gabapentin she was up to 2400 mg a day on gabapentin without any help. She discontinued the gabapentin. She continues to have discomfort in her bilateral feet. She states prolonged standing makes the pain worse. She recalls having discomfort in her knee joints for multiple years. In 2005 she underwent right knee joint arthroscopic surgery due to recurrent pain and swelling which did well for a few years. Now she is having pain in her bilateral knee joints. She states that her knee joints swell at times. She also has difficulty flexing her knee joints. She has to sit for prolonged hours at work. Which causes discomfort in her lower back. She fell off the horse about 2 years ago and hurt her coccyx bone which flares off and on. She does have some discomfort in her bilateral hands especially her right wrist and greatly she's been having discomfort in her right lateral epicondyle area.  Activities of Daily Living:  Patient reports morning stiffness for 5 minutes.   Patient Reports nocturnal pain.  Difficulty dressing/grooming: Denies Difficulty climbing stairs: Reports Difficulty getting out of  chair: Reports Difficulty using hands for taps, buttons, cutlery, and/or writing: Denies   Review of Systems  Constitutional: Positive for fatigue. Negative for night sweats, weight gain, weight loss and weakness.  HENT: Negative for mouth sores, trouble swallowing, trouble swallowing, mouth dryness and nose dryness.   Eyes: Negative for pain, redness, visual disturbance and dryness.  Respiratory: Negative for cough, shortness of breath and difficulty breathing.   Cardiovascular: Negative for chest pain, palpitations, hypertension, irregular heartbeat and swelling in legs/feet.  Gastrointestinal: Negative for blood in stool, constipation and diarrhea.  Endocrine: Negative for increased urination.  Genitourinary: Negative for vaginal dryness.  Musculoskeletal: Positive for arthralgias, joint pain, joint swelling and morning stiffness. Negative for myalgias, muscle weakness, muscle tenderness and myalgias.  Skin: Negative for color change, rash, hair loss, skin tightness, ulcers and sensitivity to sunlight.  Allergic/Immunologic: Negative for susceptible to infections.  Neurological: Negative for dizziness, memory loss and night sweats.  Hematological: Negative for swollen glands.  Psychiatric/Behavioral: Negative for depressed mood and sleep disturbance. The patient is not nervous/anxious.     PMFS History:  Patient Active Problem List   Diagnosis Date Noted  . Plantar fasciitis, bilateral 02/11/2017  . Primary osteoarthritis of both knees 02/11/2017    Past Medical History:  Diagnosis Date  . Arthritis     Family History  Problem Relation Age of Onset  . Breast cancer Maternal Grandmother  Past Surgical History:  Procedure Laterality Date  . ABLATION    . ANKLE ARTHROPLASTY    . CHOLECYSTECTOMY    . FOOT SURGERY    . KNEE ARTHROPLASTY    . OOPHORECTOMY     Social History   Social History Narrative  . No narrative on file     Objective: Vital Signs: BP 128/87 (BP  Location: Left Arm, Patient Position: Sitting, Cuff Size: Normal)   Pulse (!) 114   Resp 14   Ht '5\' 8"'  (1.727 m)   Wt 245 lb (111.1 kg)   BMI 37.25 kg/m    Physical Exam  Constitutional: She is oriented to person, place, and time. She appears well-developed and well-nourished.  HENT:  Head: Normocephalic and atraumatic.  Eyes: Conjunctivae and EOM are normal.  Neck: Normal range of motion.  Cardiovascular: Normal rate, regular rhythm, normal heart sounds and intact distal pulses.   Pulmonary/Chest: Effort normal and breath sounds normal.  Abdominal: Soft. Bowel sounds are normal.  Lymphadenopathy:    She has no cervical adenopathy.  Neurological: She is alert and oriented to person, place, and time.  Skin: Skin is warm and dry. Capillary refill takes less than 2 seconds.  Psychiatric: She has a normal mood and affect. Her behavior is normal.  Nursing note and vitals reviewed.    Musculoskeletal Exam: C-spine and thoracic spine good range of motion. She has discomfort range of motion of her lumbar spine. No SI joint tenderness was noted. Shoulder joints elbow joints wrist joint MCPs PIPs DIPs with good range of motion. No synovitis was noted. She has right lateral epicondylitis. Hip joints knee joints ankles MTPs PIPs with good range of motion. She has pain and discomfort range of motion of bilateral knee joints without any warmth swelling or effusion. She has bilateral dorsal spurs and tenderness across her MTP joints. No synovitis was noted.  CDAI Exam: No CDAI exam completed.    Investigation: No additional findings.   Imaging: Xr Foot 2 Views Left  Result Date: 02/11/2017 First MTP, PIP DIP narrowing was noted. Dorsal spur was noted. The inferior calcaneal spur was noted. Impression: These finds are consistent with osteoarthritis of the foot  Xr Foot 2 Views Right  Result Date: 02/11/2017 First MTP, PIP DIP narrowing was noted. Dorsal spur was noted. The inferior  calcaneal spur was noted. Impression: These finds are consistent with osteoarthritis of the foot  Xr Knee 3 View Left  Result Date: 02/11/2017 Moderate medial compartment narrowing with no chondrocalcinosis. Moderate patellofemoral narrowing. Impression: Moderate osteoarthritis and moderate chondromalacia patella.  Xr Knee 3 View Right  Result Date: 02/11/2017 Moderate medial compartment narrowing with no chondrocalcinosis. Moderate patellofemoral narrowing. Impression: Moderate osteoarthritis and moderate chondromalacia patella.  Xr Lumbar Spine 2-3 Views  Result Date: 02/11/2017 Mild multilevel spondylosis was noted. She has narrowing between the T12-L1 and L2-L3. Impression: Spondylosis of lumbar spine   Speciality Comments: No specialty comments available.    Procedures:  No procedures performed Allergies: Patient has no allergy information on record.   Assessment / Plan:     Visit Diagnoses: Pain in both feet -she has bilateral dorsal spurs and tenderness across her MTPs without any synovitis. She has significant pain to the point she is having discomfort with prolonged standing. I'll obtain following x-rays and labs today. Plan: XR Foot 2 Views Right, XR Foot 2 Views Left, x-rays revealed osteoarthritic changes and dorsal spurs, inferior calcaneal spurs. Sedimentation rate, Rheumatoid factor, Cyclic citrul peptide antibody, IgG,  HLA-B27 antigen, Uric acid, Angiotensin converting enzyme  Neuralgias in her feet. She had no response to gabapentin even at high dosage. We discussed the option of using Cymbalta she was in agreement the plan is to start her on Cymbalta 30 mg by mouth daily if she tolerates that she can increase it to 60 mg by mouth daily. Indications side effects contraindications were discussed at length.  Plantar fasciitis, bilateral - Status post bilateral surgery: She good response to the surgery.  Primary osteoarthritis of both knees. She has no warmth swelling or  effusion on examination although she reports discomfort range of motion. - Plan: XR KNEE 3 VIEW RIGHT, XR KNEE 3 VIEW LEFT. Moderate osteoarthritis and moderate chondromalacia patella was noted. Weight loss diet and exercise was discussed at length. Handout on knee exercises was given.  Chronic midline low back pain without sciatica. I believe weight loss would be helpful for lower back pain as well. - Plan: XR Lumbar Spine 2-3 Views. Mild spondylosis was noted. Handout on back exercises was given.  Other fatigue - Plan: CBC with Differential/Platelet, COMPLETE METABOLIC PANEL WITH GFR, ANA, Serum protein electrophoresis with reflex  Lateral epicondylitis of right elbow : Handout on exercises was given. She may also benefit from right tennis elbow brace.   Orders: Orders Placed This Encounter  Procedures  . XR KNEE 3 VIEW RIGHT  . XR KNEE 3 VIEW LEFT  . XR Foot 2 Views Right  . XR Foot 2 Views Left  . XR Lumbar Spine 2-3 Views  . CBC with Differential/Platelet  . COMPLETE METABOLIC PANEL WITH GFR  . Sedimentation rate  . Rheumatoid factor  . Cyclic citrul peptide antibody, IgG  . HLA-B27 antigen  . Uric acid  . Angiotensin converting enzyme  . ANA  . Serum protein electrophoresis with reflex   No orders of the defined types were placed in this encounter.   Face-to-face time spent with patient was 50 minutes. 50% of time was spent in counseling and coordination of care.  Follow-Up Instructions: Return for polyarthralgia.   Bo Merino, MD  Note - This record has been created using Editor, commissioning.  Chart creation errors have been sought, but may not always  have been located. Such creation errors do not reflect on  the standard of medical care.

## 2017-02-11 ENCOUNTER — Ambulatory Visit (INDEPENDENT_AMBULATORY_CARE_PROVIDER_SITE_OTHER): Payer: Self-pay

## 2017-02-11 ENCOUNTER — Ambulatory Visit (INDEPENDENT_AMBULATORY_CARE_PROVIDER_SITE_OTHER): Payer: BLUE CROSS/BLUE SHIELD | Admitting: Rheumatology

## 2017-02-11 ENCOUNTER — Encounter: Payer: Self-pay | Admitting: Rheumatology

## 2017-02-11 VITALS — BP 128/87 | HR 114 | Resp 14 | Ht 68.0 in | Wt 245.0 lb

## 2017-02-11 DIAGNOSIS — M1712 Unilateral primary osteoarthritis, left knee: Secondary | ICD-10-CM

## 2017-02-11 DIAGNOSIS — M79671 Pain in right foot: Secondary | ICD-10-CM

## 2017-02-11 DIAGNOSIS — M7711 Lateral epicondylitis, right elbow: Secondary | ICD-10-CM

## 2017-02-11 DIAGNOSIS — M79672 Pain in left foot: Secondary | ICD-10-CM | POA: Diagnosis not present

## 2017-02-11 DIAGNOSIS — M545 Low back pain, unspecified: Secondary | ICD-10-CM

## 2017-02-11 DIAGNOSIS — M1711 Unilateral primary osteoarthritis, right knee: Secondary | ICD-10-CM

## 2017-02-11 DIAGNOSIS — R5383 Other fatigue: Secondary | ICD-10-CM | POA: Diagnosis not present

## 2017-02-11 DIAGNOSIS — M722 Plantar fascial fibromatosis: Secondary | ICD-10-CM | POA: Diagnosis not present

## 2017-02-11 DIAGNOSIS — M170 Bilateral primary osteoarthritis of knee: Secondary | ICD-10-CM

## 2017-02-11 DIAGNOSIS — G8929 Other chronic pain: Secondary | ICD-10-CM | POA: Diagnosis not present

## 2017-02-11 LAB — CBC WITH DIFFERENTIAL/PLATELET
BASOS ABS: 72 {cells}/uL (ref 0–200)
Basophils Relative: 1 %
EOS ABS: 144 {cells}/uL (ref 15–500)
Eosinophils Relative: 2 %
HEMATOCRIT: 39.3 % (ref 35.0–45.0)
HEMOGLOBIN: 13.5 g/dL (ref 11.7–15.5)
LYMPHS ABS: 1224 {cells}/uL (ref 850–3900)
Lymphocytes Relative: 17 %
MCH: 32.2 pg (ref 27.0–33.0)
MCHC: 34.4 g/dL (ref 32.0–36.0)
MCV: 93.8 fL (ref 80.0–100.0)
MPV: 9.8 fL (ref 7.5–12.5)
Monocytes Absolute: 792 cells/uL (ref 200–950)
Monocytes Relative: 11 %
NEUTROS ABS: 4968 {cells}/uL (ref 1500–7800)
Neutrophils Relative %: 69 %
Platelets: 277 10*3/uL (ref 140–400)
RBC: 4.19 MIL/uL (ref 3.80–5.10)
RDW: 13 % (ref 11.0–15.0)
WBC: 7.2 10*3/uL (ref 3.8–10.8)

## 2017-02-11 MED ORDER — DULOXETINE HCL 30 MG PO CPEP: 30.0000 mg | ORAL_CAPSULE | Freq: Every day | ORAL | 2 refills | Status: DC

## 2017-02-11 NOTE — Progress Notes (Signed)
Pharmacy Note  Subjective:  Patient presents today to the [facility] to see Dr. Estanislado Pandy.  I was asked to counsel the patient on duloxetine.  Patient seen by the pharmacist for counseling on duloxetine (Cymbalta).    Objective: Vitals:   02/11/17 0826  BP: 128/87  Pulse: (!) 114  Resp: 14    CMP ordered today  Assessment/Plan: Patient was prescribed duloxetine 30 mg daily.  Patient was counseled on the purpose, proper use, and adverse effects of duloxetine including nausea, constipation, dizziness, confusion, blurry vision, increased blood pressure, increased sweating, headache, and urinary retention.  Reviewed black boxed warning of increased risk of suicidal thoughts in young adults.  Provided patient with educational materials on duloxetine and answered all questions.    Also reviewed natural anti-inflammatory supplements with patient including ginger, turmeric, omega 3, and tart cherry.  Reviewed purpose, proper use, and adverse effects of natural anti-inflammatory supplements.   Elisabeth Most, Pharm.D., BCPS, CPP Clinical Pharmacist Pager: [phone] Phone: [phone] 02/11/2017 9:26 AM

## 2017-02-11 NOTE — Patient Instructions (Addendum)
Natural anti-inflammatories  You can purchase these at [employer], [employer] or online.  . Turmeric (capsules)  . Ginger (ginger root or capsules)  . Omega 3 (Fish, flax seeds, chia seeds, walnuts, almonds)  . Tart cherry (dried or extract)   Patient should be under the care of a physician while taking these supplements. This may not be reproduced without the permission of Dr. Bo Merino.  Duloxetine delayed-release capsules What is this medicine? DULOXETINE (doo LOX e teen) is used to treat depression, anxiety, and different types of chronic pain. This medicine may be used for other purposes; ask your health care provider or pharmacist if you have questions. COMMON BRAND NAME(S): Cymbalta, [place]ren[place] What should I tell my health care provider before I take this medicine? They need to know if you have any of these conditions: -bipolar disorder or a family history of bipolar disorder -glaucoma -kidney disease -liver disease -suicidal thoughts or a previous suicide attempt -taken medicines called MAOIs like Carbex, Eldepryl, Marplan, Nardil, and Parnate within 14 days -an unusual reaction to duloxetine, other medicines, foods, dyes, or preservatives -pregnant or trying to get pregnant -breast-feeding How should I use this medicine? Take this medicine by mouth with a glass of water. Follow the directions on the prescription label. Do not cut, crush or chew this medicine. You can take this medicine with or without food. Take your medicine at regular intervals. Do not take your medicine more often than directed. Do not stop taking this medicine suddenly except upon the advice of your doctor. Stopping this medicine too quickly may cause serious side effects or your condition may worsen. A special MedGuide will be given to you by the pharmacist with each prescription and refill. Be sure to read this information carefully each time. Talk to your pediatrician regarding the use of this  medicine in children. While this drug may be prescribed for children as young as 37 years of age for selected conditions, precautions do apply. Overdosage: If you think you have taken too much of this medicine contact a poison control center or emergency room at once. NOTE: This medicine is only for you. Do not share this medicine with others. What if I miss a dose? If you miss a dose, take it as soon as you can. If it is almost time for your next dose, take only that dose. Do not take double or extra doses. What may interact with this medicine? Do not take this medicine with any of the following medications: -desvenlafaxine -levomilnacipran -linezolid -MAOIs like Carbex, Eldepryl, Marplan, Nardil, and Parnate -methylene blue (injected into a vein) -milnacipran -thioridazine -venlafaxine This medicine may also interact with the following medications: -alcohol -amphetamines -aspirin and aspirin-like medicines -certain antibiotics like ciprofloxacin and enoxacin -certain medicines for blood pressure, heart disease, irregular heart beat -certain medicines for depression, anxiety, or psychotic disturbances -certain medicines for migraine headache like almotriptan, eletriptan, frovatriptan, naratriptan, rizatriptan, sumatriptan, zolmitriptan -certain medicines that treat or prevent blood clots like warfarin, enoxaparin, and dalteparin -cimetidine -fentanyl -lithium -NSAIDS, medicines for pain and inflammation, like ibuprofen or naproxen -phentermine -procarbazine -rasagiline -sibutramine -St. John's wort -theophylline -tramadol -tryptophan This list may not describe all possible interactions. Give your health care provider a list of all the medicines, herbs, non-prescription drugs, or dietary supplements you use. Also tell them if you smoke, drink alcohol, or use illegal drugs. Some items may interact with your medicine. What should I watch for while using this medicine? Tell your  doctor if your symptoms do  not get better or if they get worse. Visit your doctor or health care professional for regular checks on your progress. Because it may take several weeks to see the full effects of this medicine, it is important to continue your treatment as prescribed by your doctor. Patients and their families should watch out for new or worsening thoughts of suicide or depression. Also watch out for sudden changes in feelings such as feeling anxious, agitated, panicky, irritable, hostile, aggressive, impulsive, severely restless, overly excited and hyperactive, or not being able to sleep. If this happens, especially at the beginning of treatment or after a change in dose, call your health care professional. Dennis Bast may get drowsy or dizzy. Do not drive, use machinery, or do anything that needs mental alertness until you know how this medicine affects you. Do not stand or sit up quickly, especially if you are an older patient. This reduces the risk of dizzy or fainting spells. Alcohol may interfere with the effect of this medicine. Avoid alcoholic drinks. This medicine can cause an increase in blood pressure. This medicine can also cause a sudden drop in your blood pressure, which may make you feel faint and increase the chance of a fall. These effects are most common when you first start the medicine or when the dose is increased, or during use of other medicines that can cause a sudden drop in blood pressure. Check with your doctor for instructions on monitoring your blood pressure while taking this medicine. Your mouth may get dry. Chewing sugarless gum or sucking hard candy, and drinking plenty of water may help. Contact your doctor if the problem does not go away or is severe. What side effects may I notice from receiving this medicine? Side effects that you should report to your doctor or health care professional as soon as possible: -allergic reactions like skin rash, itching or hives, swelling  of the face, lips, or tongue -anxious -breathing problems -confusion -changes in vision -chest pain -confusion -elevated mood, decreased need for sleep, racing thoughts, impulsive behavior -eye pain -fast, irregular heartbeat -feeling faint or lightheaded, falls -feeling agitated, angry, or irritable -hallucination, loss of contact with reality -high blood pressure -loss of balance or coordination -palpitations -redness, blistering, peeling or loosening of the skin, including inside the mouth -restlessness, pacing, inability to keep still -seizures -stiff muscles -suicidal thoughts or other mood changes -trouble passing urine or change in the amount of urine -trouble sleeping -unusual bleeding or bruising -unusually weak or tired -vomiting -yellowing of the eyes or skin Side effects that usually do not require medical attention (report to your doctor or health care professional if they continue or are bothersome): -change in sex drive or performance -change in appetite or weight -constipation -dizziness -dry mouth -headache -increased sweating -nausea -tired This list may not describe all possible side effects. Call your doctor for medical advice about side effects. You may report side effects to FDA at 1-800-FDA-1088. Where should I keep my medicine? Keep out of the reach of children. Store at room temperature between 20 and 25 degrees C (68 to 77 degrees F). Throw away any unused medicine after the expiration date. NOTE: This sheet is a summary. It may not cover all possible information. If you have questions about this medicine, talk to your doctor, pharmacist, or health care provider.  2018 [employer] (2015-11-10 18:16:03)  Knee Exercises Ask your health care provider which exercises are safe for you. Do exercises exactly as told by your health care provider  and adjust them as directed. It is normal to feel mild stretching, pulling, tightness, or  discomfort as you do these exercises, but you should stop right away if you feel sudden pain or your pain gets worse.Do not begin these exercises until told by your health care provider. STRETCHING AND RANGE OF MOTION EXERCISES These exercises warm up your muscles and joints and improve the movement and flexibility of your knee. These exercises also help to relieve pain, numbness, and tingling. Exercise A: Knee Extension, Prone 1. Lie on your abdomen on a bed. 2. Place your left / right knee just beyond the edge of the surface so your knee is not on the bed. You can put a towel under your left / right thigh just above your knee for comfort. 3. Relax your leg muscles and allow gravity to straighten your knee. You should feel a stretch behind your left / right knee. 4. Hold this position for __________ seconds. 5. Scoot up so your knee is supported between repetitions. Repeat __________ times. Complete this stretch __________ times a day. Exercise B: Knee Flexion, Active  1. Lie on your back with both knees straight. If this causes back discomfort, bend your left / right knee so your foot is flat on the floor. 2. Slowly slide your left / right heel back toward your buttocks until you feel a gentle stretch in the front of your knee or thigh. 3. Hold this position for __________ seconds. 4. Slowly slide your left / right heel back to the starting position. Repeat __________ times. Complete this exercise __________ times a day. Exercise C: Quadriceps, Prone  1. Lie on your abdomen on a firm surface, such as a bed or padded floor. 2. Bend your left / right knee and hold your ankle. If you cannot reach your ankle or pant leg, loop a belt around your foot and grab the belt instead. 3. Gently pull your heel toward your buttocks. Your knee should not slide out to the side. You should feel a stretch in the front of your thigh and knee. 4. Hold this position for __________ seconds. Repeat __________  times. Complete this stretch __________ times a day. Exercise D: Hamstring, Supine 1. Lie on your back. 2. Loop a belt or towel over the ball of your left / right foot. The ball of your foot is on the walking surface, right under your toes. 3. Straighten your left / right knee and slowly pull on the belt to raise your leg until you feel a gentle stretch behind your knee. ? Do not let your left / right knee bend while you do this. ? Keep your other leg flat on the floor. 4. Hold this position for __________ seconds. Repeat __________ times. Complete this stretch __________ times a day. STRENGTHENING EXERCISES These exercises build strength and endurance in your knee. Endurance is the ability to use your muscles for a long time, even after they get tired. Exercise E: Quadriceps, Isometric  1. Lie on your back with your left / right leg extended and your other knee bent. Put a rolled towel or small pillow under your knee if told by your health care provider. 2. Slowly tense the muscles in the front of your left / right thigh. You should see your kneecap slide up toward your hip or see increased dimpling just above the knee. This motion will push the back of the knee toward the floor. 3. For __________ seconds, keep the muscle as tight as you  can without increasing your pain. 4. Relax the muscles slowly and completely. Repeat __________ times. Complete this exercise __________ times a day. Exercise F: Straight Leg Raises - Quadriceps 1. Lie on your back with your left / right leg extended and your other knee bent. 2. Tense the muscles in the front of your left / right thigh. You should see your kneecap slide up or see increased dimpling just above the knee. Your thigh may even shake a bit. 3. Keep these muscles tight as you raise your leg 4-6 inches (10-15 cm) off the floor. Do not let your knee bend. 4. Hold this position for __________ seconds. 5. Keep these muscles tense as you lower your  leg. 6. Relax your muscles slowly and completely after each repetition. Repeat __________ times. Complete this exercise __________ times a day. Exercise G: Hamstring, Isometric 1. Lie on your back on a firm surface. 2. Bend your left / right knee approximately __________ degrees. 3. Dig your left / right heel into the surface as if you are trying to pull it toward your buttocks. Tighten the muscles in the back of your thighs to dig as hard as you can without increasing any pain. 4. Hold this position for __________ seconds. 5. Release the tension gradually and allow your muscles to relax completely for __________ seconds after each repetition. Repeat __________ times. Complete this exercise __________ times a day. Exercise H: Hamstring Curls  If told by your health care provider, do this exercise while wearing ankle weights. Begin with __________ weights. Then increase the weight by 1 lb (0.5 kg) increments. Do not wear ankle weights that are more than __________. 1. Lie on your abdomen with your legs straight. 2. Bend your left / right knee as far as you can without feeling pain. Keep your hips flat against the floor. 3. Hold this position for __________ seconds. 4. Slowly lower your leg to the starting position.  Repeat __________ times. Complete this exercise __________ times a day. Exercise I: Squats (Quadriceps) 1. Stand in front of a table, with your feet and knees pointing straight ahead. You may rest your hands on the table for balance but not for support. 2. Slowly bend your knees and lower your hips like you are going to sit in a chair. ? Keep your weight over your heels, not over your toes. ? Keep your lower legs upright so they are parallel with the table legs. ? Do not let your hips go lower than your knees. ? Do not bend lower than told by your health care provider. ? If your knee pain increases, do not bend as low. 3. Hold the squat position for __________  seconds. 4. Slowly push with your legs to return to standing. Do not use your hands to pull yourself to standing. Repeat __________ times. Complete this exercise __________ times a day. Exercise J: Wall Slides (Quadriceps)  1. Lean your back against a smooth wall or door while you walk your feet out 18-24 inches (46-61 cm) from it. 2. Place your feet hip-width apart. 3. Slowly slide down the wall or door until your knees bend __________ degrees. Keep your knees over your heels, not over your toes. Keep your knees in line with your hips. 4. Hold for __________ seconds. Repeat __________ times. Complete this exercise __________ times a day. Exercise K: Straight Leg Raises - Hip Abductors 1. Lie on your side with your left / right leg in the top position. Lie so your head, shoulder,  knee, and hip line up. You may bend your bottom knee to help you keep your balance. 2. Roll your hips slightly forward so your hips are stacked directly over each other and your left / right knee is facing forward. 3. Leading with your heel, lift your top leg 4-6 inches (10-15 cm). You should feel the muscles in your outer hip lifting. ? Do not let your foot drift forward. ? Do not let your knee roll toward the ceiling. 4. Hold this position for __________ seconds. 5. Slowly return your leg to the starting position. 6. Let your muscles relax completely after each repetition. Repeat __________ times. Complete this exercise __________ times a day. Exercise L: Straight Leg Raises - Hip Extensors 1. Lie on your abdomen on a firm surface. You can put a pillow under your hips if that is more comfortable. 2. Tense the muscles in your buttocks and lift your left / right leg about 4-6 inches (10-15 cm). Keep your knee straight as you lift your leg. 3. Hold this position for __________ seconds. 4. Slowly lower your leg to the starting position. 5. Let your leg relax completely after each repetition. Repeat __________ times.  Complete this exercise __________ times a day. This information is not intended to replace advice given to you by your health care provider. Make sure you discuss any questions you have with your health care provider. Document Released: 04/25/2005 Document Revised: 03/05/2016 Document Reviewed: 04/17/2015 Elsevier Interactive Patient Education  2018 [place].  Elbow and Forearm Exercises Ask your health care provider which exercises are safe for you. Do exercises exactly as told by your health care provider and adjust them as directed. It is normal to feel mild stretching, pulling, tightness, or discomfort as you do these exercises, but you should stop right away if you feel sudden pain or your pain gets worse.Do not begin these exercises until told by your health care provider. RANGE OF MOTION EXERCISES These exercises warm up your muscles and joints and improve the movement and flexibility of your injured elbow and forearm. These exercises also help to relieve pain, numbness, and tingling.These exercises are done using the muscles in your injured elbow and forearm. Exercise A: Elbow Flexion, Active 1. Hold your left / right arm at your side, and bend your elbow as far as you can using your left / right arm muscles. 2. Hold this position for __________ seconds. 3. Slowly return to the starting position. Repeat __________ times. Complete this exercise __________ times a day. Exercise B: Elbow Extension, Active 1. Hold your left / right arm at your side, and straighten your elbow as much as you can using your left / right arm muscles. 2. Hold this position for __________ seconds. 3. Slowly return to the starting position. Repeat __________ times. Complete this exercise __________ times a day. Exercise C: Forearm Rotation, Supination, Active 1. Stand or sit with your elbows at your sides. 2. Bend your left / right elbow to an "L" shape (90 degrees). 3. Turn your palm upward until you feel  a gentle stretch on the inside of your forearm. 4. Hold this position for __________ seconds. 5. Slowly release and return to the starting position. Repeat __________ times. Complete this exercise __________ times a day. Exercise D: Forearm Rotation, Pronation, Active 1. Stand or sit with your elbows at your side. 2. Bend your left / right elbow to an "L" shape (90 degrees). 3. Turn your left / right palm downward until you feel  a gentle stretch on the top of your forearm. 4. Hold this position for __________ seconds. 5. Slowly release and return to the starting position. Repeat__________ times. Complete this exercise __________ times a day. STRETCHING EXERCISES These exercises warm up your muscles and joints and improve the movement and flexibility of your injured elbow and forearm. These exercises also help to relieve pain, numbness, and tingling.These exercises are done using your healthy elbow and forearm to help stretch the muscles in your injured elbow and forearm. Exercise E: Elbow Flexion, Active-Assisted  1. Hold your left / right arm at your side, and bend your elbow as much as you can using your left / right arm muscles. 2. Use your other hand to bend your left / right elbow farther. To do this, gently push up on your forearm until you feel a gentle stretch on the back of your elbow. 3. Hold this position for __________ seconds. 4. Slowly return to the starting position. Repeat __________ times. Complete this exercise __________ times a day. Exercise F: Elbow Extension, Active-Assisted  1. Hold your left / right arm at your side, and straighten your elbow as much as you can using your left / right arm muscles. 2. Use your other hand to straighten the left / right elbow farther. To do this, gently push down on your forearm until you feel a gentle stretch on the inside of your elbow. 3. Hold this position for __________ seconds. 4. Slowly return to the starting position. Repeat  __________ times. Complete this exercise __________ times a day. Exercise G: Forearm Rotation, Supination, Active-Assisted  1. Sit with your left / right elbow bent in an "L" shape (90 degrees) with your forearm resting on a table. 2. Keeping your upper body and shoulder still, rotate your forearm so your left / right palm faces upward. 3. Use your other hand to help rotate your forearm further until you feel a gentle to moderate stretch. 4. Hold this position for __________ seconds. 5. Slowly release the stretch and return to the starting position. Repeat __________ times. Complete this exercise __________ times a day. Exercise H: Forearm Rotation, Pronation, Active-Assisted  1. Sit with your left / right elbow bent in an "L" shape (90 degrees) with your forearm resting on a table. 2. Keeping your upper body and shoulder still, rotate your forearm so your palm faces the tabletop. 3. Use your other hand to help rotate your forearm further until you feel a gentle to moderate stretch. 4. Hold this position for __________ seconds. 5. Slowly release the stretch and return to the starting position. Repeat __________ times. Complete this exercise __________ times a day. Exercise I: Elbow Flexion, Supine, Passive 1. Lie on your back. 2. Extend your left / right arm up in the air, bracing it with your other hand. 3. Let your left / right your hand slowly lower toward your shoulder, while your elbow stays pointed toward the ceiling. You should feel a gentle stretch along the back of your upper arm and elbow. 4. If instructed by your health care provider, you may increase the intensity of your stretch by adding a small wrist weight or hand weight. 5. Hold this position for __________ seconds. 6. Slowly return to the starting position. Repeat __________ times. Complete this exercise __________ times a day. Exercise J: Elbow Extension, Supine, Passive  1. Lie on your back. Make sure that you are in a  comfortable position that lets you relax your arm muscles. 2. Place a  folded towel under your left / right upper arm so your elbow and shoulder are at the same height. Straighten your left / right arm so your elbow does not rest on the bed or towel. 3. Let the weight of your hand stretch your elbow. Keep your arm and chest muscles relaxed. You should feel a stretch on the inside of your elbow. 4. If told by your health care provider, you may increase the intensity of your stretch by adding a small wrist weight or hand weight. 5. Hold this position for__________ seconds. 6. Slowly release the stretch. Repeat __________ times. Complete this exercise __________ times a day. STRENGTHENING EXERCISES These exercises build strength and endurance in your elbow and forearm. Endurance is the ability to use your muscles for a long time, even after they get tired. Exercise K: Elbow Flexion, Isometric  1. Stand or sit up straight. 2. Bend your left / right elbow in an "L" shape (90 degrees) and turn your palm up so your forearm is at the height of your waist. 3. Place your other hand on top of your forearm. Gently push down as your left / right arm resists. Push as hard as you can with both arms without causing any pain or movement at your left / right elbow. 4. Hold this position for __________ seconds. 5. Slowly release the tension in both arms. Let your muscles relax completely before repeating. Repeat __________ times. Complete this exercise __________ times a day. Exercise L: Elbow Extensors, Isometric  1. Stand or sit up straight. 2. Place your left / right arm so your palm faces your abdomen and it is at the height of your waist. 3. Place your other hand on the underside of your forearm. Gently push up as your left / right arm resists. Push as hard as you can with both arms, without causing any pain or movement at your left / right elbow. 4. Hold this position for __________ seconds. 5. Slowly  release the tension in both arms. Let your muscles relax completely before repeating. Repeat __________ times. Complete this exercise __________ times a day. Exercise M: Elbow Flexion With Forearm Palm Up  1. Sit upright on a firm chair without armrests, or stand. 2. Place your left / right arm at your side with your palm facing forward. 3. Holding a __________weight or gripping a rubber exercise band or tubing, bend your elbow to bring your hand toward your shoulder. 4. Hold this position for __________ seconds. 5. Slowly return to the starting position. Repeat __________times. Complete this exercise __________times a day. Exercise N: Elbow Extension  1. Sit on a firm chair without armrests, or stand. 2. Keeping your upper arms at your sides, bring both hands up toward your left / right shoulder while you grip a rubber exercise band or tubing. Your left / right hand should be just below the other hand. 3. Straighten your left / right elbow. 4. Hold this position for __________ seconds. 5. Control the resistance of the band or tubing as your hand returns to your side. Repeat __________times. Complete this exercise __________times a day. Exercise O: Forearm Rotation, Supination  1. Sit with your left / right forearm supported on a table. Keep your elbow at waist height. 2. Rest your hand over the edge of the table with your palm facing down. 3. Gently hold a lightweight hammer. 4. Without moving your elbow, slowly rotate your forearm to turn your palm and hand upward to a "thumbs-up" position. 5. Hold  this position for __________ seconds. 6. Slowly return to the starting position. Repeat __________times. Complete this exercise __________times a day. Exercise P: Forearm Rotation, Pronation  1. Sit with your left / right forearm supported on a table. Keep your elbow below shoulder height. 2. Rest your hand over the edge of the table with your palm facing up. 3. Gently hold a lightweight  hammer. 4. Without moving your elbow, slowly rotate your forearm to turn your palm and hand upward to a "thumbs-up" position. 5. Hold this position for __________seconds. 6. Slowly return to the starting position. Repeat __________times. Complete this exercise __________times a day. This information is not intended to replace advice given to you by your health care provider. Make sure you discuss any questions you have with your health care provider. Document Released: 04/25/2005 Document Revised: 10/20/2015 Document Reviewed: 03/06/2015 Elsevier Interactive Patient Education  2018 [employer].  Knee Exercises Ask your health care provider which exercises are safe for you. Do exercises exactly as told by your health care provider and adjust them as directed. It is normal to feel mild stretching, pulling, tightness, or discomfort as you do these exercises, but you should stop right away if you feel sudden pain or your pain gets worse.Do not begin these exercises until told by your health care provider. STRETCHING AND RANGE OF MOTION EXERCISES These exercises warm up your muscles and joints and improve the movement and flexibility of your knee. These exercises also help to relieve pain, numbness, and tingling. Exercise A: Knee Extension, Prone 6. Lie on your abdomen on a bed. 7. Place your left / right knee just beyond the edge of the surface so your knee is not on the bed. You can put a towel under your left / right thigh just above your knee for comfort. 8. Relax your leg muscles and allow gravity to straighten your knee. You should feel a stretch behind your left / right knee. 9. Hold this position for __________ seconds. 10. Scoot up so your knee is supported between repetitions. Repeat __________ times. Complete this stretch __________ times a day. Exercise B: Knee Flexion, Active  5. Lie on your back with both knees straight. If this causes back discomfort, bend your left / right knee so  your foot is flat on the floor. 6. Slowly slide your left / right heel back toward your buttocks until you feel a gentle stretch in the front of your knee or thigh. 7. Hold this position for __________ seconds. 8. Slowly slide your left / right heel back to the starting position. Repeat __________ times. Complete this exercise __________ times a day. Exercise C: Quadriceps, Prone  5. Lie on your abdomen on a firm surface, such as a bed or padded floor. 6. Bend your left / right knee and hold your ankle. If you cannot reach your ankle or pant leg, loop a belt around your foot and grab the belt instead. 7. Gently pull your heel toward your buttocks. Your knee should not slide out to the side. You should feel a stretch in the front of your thigh and knee. 8. Hold this position for __________ seconds. Repeat __________ times. Complete this stretch __________ times a day. Exercise D: Hamstring, Supine 5. Lie on your back. 6. Loop a belt or towel over the ball of your left / right foot. The ball of your foot is on the walking surface, right under your toes. 7. Straighten your left / right knee and slowly pull on the  belt to raise your leg until you feel a gentle stretch behind your knee. ? Do not let your left / right knee bend while you do this. ? Keep your other leg flat on the floor. 8. Hold this position for __________ seconds. Repeat __________ times. Complete this stretch __________ times a day. STRENGTHENING EXERCISES These exercises build strength and endurance in your knee. Endurance is the ability to use your muscles for a long time, even after they get tired. Exercise E: Quadriceps, Isometric  5. Lie on your back with your left / right leg extended and your other knee bent. Put a rolled towel or small pillow under your knee if told by your health care provider. 6. Slowly tense the muscles in the front of your left / right thigh. You should see your kneecap slide up toward your hip or  see increased dimpling just above the knee. This motion will push the back of the knee toward the floor. 7. For __________ seconds, keep the muscle as tight as you can without increasing your pain. 8. Relax the muscles slowly and completely. Repeat __________ times. Complete this exercise __________ times a day. Exercise F: Straight Leg Raises - Quadriceps 7. Lie on your back with your left / right leg extended and your other knee bent. 8. Tense the muscles in the front of your left / right thigh. You should see your kneecap slide up or see increased dimpling just above the knee. Your thigh may even shake a bit. 9. Keep these muscles tight as you raise your leg 4-6 inches (10-15 cm) off the floor. Do not let your knee bend. 10. Hold this position for __________ seconds. 11. Keep these muscles tense as you lower your leg. 12. Relax your muscles slowly and completely after each repetition. Repeat __________ times. Complete this exercise __________ times a day. Exercise G: Hamstring, Isometric 6. Lie on your back on a firm surface. 7. Bend your left / right knee approximately __________ degrees. 8. Dig your left / right heel into the surface as if you are trying to pull it toward your buttocks. Tighten the muscles in the back of your thighs to dig as hard as you can without increasing any pain. 9. Hold this position for __________ seconds. 10. Release the tension gradually and allow your muscles to relax completely for __________ seconds after each repetition. Repeat __________ times. Complete this exercise __________ times a day. Exercise H: Hamstring Curls  If told by your health care provider, do this exercise while wearing ankle weights. Begin with __________ weights. Then increase the weight by 1 lb (0.5 kg) increments. Do not wear ankle weights that are more than __________. 5. Lie on your abdomen with your legs straight. 6. Bend your left / right knee as far as you can without feeling  pain. Keep your hips flat against the floor. 7. Hold this position for __________ seconds. 8. Slowly lower your leg to the starting position.  Repeat __________ times. Complete this exercise __________ times a day. Exercise I: Squats (Quadriceps) 5. Stand in front of a table, with your feet and knees pointing straight ahead. You may rest your hands on the table for balance but not for support. 6. Slowly bend your knees and lower your hips like you are going to sit in a chair. ? Keep your weight over your heels, not over your toes. ? Keep your lower legs upright so they are parallel with the table legs. ? Do not let your  hips go lower than your knees. ? Do not bend lower than told by your health care provider. ? If your knee pain increases, do not bend as low. 7. Hold the squat position for __________ seconds. 8. Slowly push with your legs to return to standing. Do not use your hands to pull yourself to standing. Repeat __________ times. Complete this exercise __________ times a day. Exercise J: Wall Slides (Quadriceps)  5. Lean your back against a smooth wall or door while you walk your feet out 18-24 inches (46-61 cm) from it. 6. Place your feet hip-width apart. 7. Slowly slide down the wall or door until your knees bend __________ degrees. Keep your knees over your heels, not over your toes. Keep your knees in line with your hips. 8. Hold for __________ seconds. Repeat __________ times. Complete this exercise __________ times a day. Exercise K: Straight Leg Raises - Hip Abductors 7. Lie on your side with your left / right leg in the top position. Lie so your head, shoulder, knee, and hip line up. You may bend your bottom knee to help you keep your balance. 8. Roll your hips slightly forward so your hips are stacked directly over each other and your left / right knee is facing forward. 9. Leading with your heel, lift your top leg 4-6 inches (10-15 cm). You should feel the muscles in your  outer hip lifting. ? Do not let your foot drift forward. ? Do not let your knee roll toward the ceiling. 10. Hold this position for __________ seconds. 11. Slowly return your leg to the starting position. 12. Let your muscles relax completely after each repetition. Repeat __________ times. Complete this exercise __________ times a day. Exercise L: Straight Leg Raises - Hip Extensors 6. Lie on your abdomen on a firm surface. You can put a pillow under your hips if that is more comfortable. 7. Tense the muscles in your buttocks and lift your left / right leg about 4-6 inches (10-15 cm). Keep your knee straight as you lift your leg. 8. Hold this position for __________ seconds. 9. Slowly lower your leg to the starting position. 10. Let your leg relax completely after each repetition. Repeat __________ times. Complete this exercise __________ times a day. This information is not intended to replace advice given to you by your health care provider. Make sure you discuss any questions you have with your health care provider. Document Released: 04/25/2005 Document Revised: 03/05/2016 Document Reviewed: 04/17/2015 Elsevier Interactive Patient Education  2018 [employer].  Back Exercises The following exercises strengthen the muscles that help to support the back. They also help to keep the lower back flexible. Doing these exercises can help to prevent back pain or lessen existing pain. If you have back pain or discomfort, try doing these exercises 2-3 times each day or as told by your health care provider. When the pain goes away, do them once each day, but increase the number of times that you repeat the steps for each exercise (do more repetitions). If you do not have back pain or discomfort, do these exercises once each day or as told by your health care provider. Exercises Single Knee to Chest  Repeat these steps 3-5 times for each leg: 11. Lie on your back on a firm bed or the floor with your  legs extended. 12. Bring one knee to your chest. Your other leg should stay extended and in contact with the floor. 22. Hold your knee in place by  grabbing your knee or thigh. 14. Pull on your knee until you feel a gentle stretch in your lower back. 15. Hold the stretch for 10-30 seconds. 16. Slowly release and straighten your leg.  Pelvic Tilt  Repeat these steps 5-10 times: 9. Lie on your back on a firm bed or the floor with your legs extended. [place] your knees so they are pointing toward the ceiling and your feet are flat on the floor. 27. Tighten your lower abdominal muscles to press your lower back against the floor. This motion will tilt your pelvis so your tailbone points up toward the ceiling instead of pointing to your feet or the floor. 12. With gentle tension and even breathing, hold this position for 5-10 seconds.  Cat-Cow  Repeat these steps until your lower back becomes more flexible: 9. Get into a hands-and-knees position on a firm surface. Keep your hands under your shoulders, and keep your knees under your hips. You may place padding under your knees for comfort. 10. Let your head hang down, and point your tailbone toward the floor so your lower back becomes rounded like the back of a cat. 11. Hold this position for 5 seconds. 12. Slowly lift your head and point your tailbone up toward the ceiling so your back forms a sagging arch like the back of a cow. 13. Hold this position for 5 seconds.  Press-Ups  Repeat these steps 5-10 times: 1. Lie on your abdomen (face-down) on the floor. 2. Place your palms near your head, about shoulder-width apart. 3. While you keep your back as relaxed as possible and keep your hips on the floor, slowly straighten your arms to raise the top half of your body and lift your shoulders. Do not use your back muscles to raise your upper torso. You may adjust the placement of your hands to make yourself more comfortable. 4. Hold this position  for 5 seconds while you keep your back relaxed. 5. Slowly return to lying flat on the floor.  Bridges  Repeat these steps 10 times: 9. Lie on your back on a firm surface. [place] your knees so they are pointing toward the ceiling and your feet are flat on the floor. 65. Tighten your buttocks muscles and lift your buttocks off of the floor until your waist is at almost the same height as your knees. You should feel the muscles working in your buttocks and the back of your thighs. If you do not feel these muscles, slide your feet 1-2 inches farther away from your buttocks. 12. Hold this position for 3-5 seconds. 13. Slowly lower your hips to the starting position, and allow your buttocks muscles to relax completely.  If this exercise is too easy, try doing it with your arms crossed over your chest. Abdominal Crunches  Repeat these steps 5-10 times: 13. Lie on your back on a firm bed or the floor with your legs extended. [place] your knees so they are pointing toward the ceiling and your feet are flat on the floor. 54. Cross your arms over your chest. 16. Tip your chin slightly toward your chest without bending your neck. 65. Tighten your abdominal muscles and slowly raise your trunk (torso) high enough to lift your shoulder blades a tiny bit off of the floor. Avoid raising your torso higher than that, because it can put too much stress on your low back and it does not help to strengthen your abdominal muscles. 18. Slowly return to your starting  position.  Back Lifts Repeat these steps 5-10 times: 11. Lie on your abdomen (face-down) with your arms at your sides, and rest your forehead on the floor. 12. Tighten the muscles in your legs and your buttocks. 13. Slowly lift your chest off of the floor while you keep your hips pressed to the floor. Keep the back of your head in line with the curve in your back. Your eyes should be looking at the floor. 14. Hold this position for 3-5  seconds. 15. Slowly return to your starting position.  Contact a health care provider if:  Your back pain or discomfort gets much worse when you do an exercise.  Your back pain or discomfort does not lessen within 2 hours after you exercise. If you have any of these problems, stop doing these exercises right away. Do not do them again unless your health care provider says that you can. Get help right away if:  You develop sudden, severe back pain. If this happens, stop doing the exercises right away. Do not do them again unless your health care provider says that you can. This information is not intended to replace advice given to you by your health care provider. Make sure you discuss any questions you have with your health care provider. Document Released: 07/19/2004 Document Revised: 10/19/2015 Document Reviewed: 08/05/2014 Elsevier Interactive Patient Education  2017 [employer].

## 2017-02-12 LAB — SEDIMENTATION RATE: Sed Rate: 13 mm/hr (ref 0–20)

## 2017-02-12 LAB — COMPLETE METABOLIC PANEL WITH GFR
ALBUMIN: 4.1 g/dL (ref 3.6–5.1)
ALK PHOS: 81 U/L (ref 33–130)
ALT: 20 U/L (ref 6–29)
AST: 17 U/L (ref 10–35)
BILIRUBIN TOTAL: 0.3 mg/dL (ref 0.2–1.2)
BUN: 14 mg/dL (ref 7–25)
CALCIUM: 9.2 mg/dL (ref 8.6–10.4)
CO2: 21 mmol/L (ref 20–32)
Chloride: 109 mmol/L (ref 98–110)
Creat: 0.76 mg/dL (ref 0.50–1.05)
GFR, Est African American: >89 mL/min (ref >=60)
GLUCOSE: 102 mg/dL — AB (ref 65–99)
Potassium: 4.2 mmol/L (ref 3.5–5.3)
Sodium: 144 mmol/L (ref 135–146)
TOTAL PROTEIN: 6.7 g/dL (ref 6.1–8.1)

## 2017-02-12 LAB — RHEUMATOID FACTOR

## 2017-02-12 LAB — ANA: ANA: POSITIVE — AB

## 2017-02-12 LAB — ANGIOTENSIN CONVERTING ENZYME: Angiotensin-Converting Enzyme: 35 U/L (ref 9–67)

## 2017-02-12 LAB — ANTI-NUCLEAR AB-TITER (ANA TITER): ANA Titer 1: 1:160 {titer} — ABNORMAL HIGH

## 2017-02-12 LAB — CYCLIC CITRUL PEPTIDE ANTIBODY, IGG

## 2017-02-12 LAB — URIC ACID: Uric Acid, Serum: 5 mg/dL (ref 2.5–7.0)

## 2017-02-14 LAB — PROTEIN ELECTROPHORESIS, SERUM, WITH REFLEX
ALPHA-2-GLOBULIN: 0.8 g/dL (ref 0.5–0.9)
Albumin ELP: 3.9 g/dL (ref 3.8–4.8)
Alpha-1-Globulin: 0.3 g/dL (ref 0.2–0.3)
BETA GLOBULIN: 0.4 g/dL (ref 0.4–0.6)
Beta 2: 0.4 g/dL (ref 0.2–0.5)
GAMMA GLOBULIN: 0.9 g/dL (ref 0.8–1.7)
Total Protein, Serum Electrophoresis: 6.7 g/dL (ref 6.1–8.1)

## 2017-02-15 LAB — HLA-B27 ANTIGEN: DNA Result:: NEGATIVE

## 2017-02-15 NOTE — Progress Notes (Signed)
WNLs

## 2017-03-18 DIAGNOSIS — M19072 Primary osteoarthritis, left ankle and foot: Secondary | ICD-10-CM

## 2017-03-18 DIAGNOSIS — M19071 Primary osteoarthritis, right ankle and foot: Secondary | ICD-10-CM | POA: Insufficient documentation

## 2017-03-18 DIAGNOSIS — M5136 Other intervertebral disc degeneration, lumbar region: Secondary | ICD-10-CM | POA: Insufficient documentation

## 2017-03-18 NOTE — Progress Notes (Signed)
Office Visit Note  Patient: Bethany Johns. [place]             Date of Birth: [birth year]           MRN: [id number]             PCP: Elesa Massed, NP Referring: No ref. provider found Visit Date: 03/19/2017 Occupation: '@GUAROCC' @    Subjective:  Right elbow pain.   History of Present Illness: Bethany Johns is a 51 y.o. female with history of osteoarthritis and disc disease of lumbar spine. She's been having discomfort in her right lateral epicondyle area. She states the symptoms are somewhat worse than the last visit. She tried to do some of the exercises. She has noticed some improvement in the neurology is on Cymbalta. She also has some distal disease of lumbar spine which causes discomfort.  Activities of Daily Living:  Patient reports morning stiffness for 30 minutes.   Patient Denies nocturnal pain.  Difficulty dressing/grooming: Denies Difficulty climbing stairs: Reports Difficulty getting out of chair: Reports Difficulty using hands for taps, buttons, cutlery, and/or writing: Denies   Review of Systems  Constitutional: Positive for fatigue. Negative for night sweats, weight gain, weight loss and weakness.  HENT: Negative for mouth sores, trouble swallowing, trouble swallowing, mouth dryness and nose dryness.   Eyes: Negative for pain, redness, visual disturbance and dryness.  Respiratory: Negative for cough, shortness of breath and difficulty breathing.   Cardiovascular: Negative for chest pain, palpitations, hypertension, irregular heartbeat and swelling in legs/feet.  Gastrointestinal: Negative for blood in stool, constipation and diarrhea.  Endocrine: Negative for increased urination.  Genitourinary: Negative for vaginal dryness.  Musculoskeletal: Positive for arthralgias, joint pain, myalgias, morning stiffness and myalgias. Negative for joint swelling, muscle weakness and muscle tenderness.  Skin: Negative for color change, rash, hair loss, skin tightness, ulcers and  sensitivity to sunlight.  Allergic/Immunologic: Negative for susceptible to infections.  Neurological: Negative for dizziness, memory loss and night sweats.  Hematological: Negative for swollen glands.  Psychiatric/Behavioral: Positive for depressed mood and sleep disturbance. The patient is not nervous/anxious.     PMFS History:  Patient Active Problem List   Diagnosis Date Noted  . Primary osteoarthritis of both feet 03/18/2017  . DDD (degenerative disc disease), lumbar 03/18/2017  . Plantar fasciitis, bilateral 02/11/2017  . Primary osteoarthritis of both knees 02/11/2017    Past Medical History:  Diagnosis Date  . Arthritis     Family History  Problem Relation Age of Onset  . Breast cancer Maternal Grandmother    Past Surgical History:  Procedure Laterality Date  . ABLATION    . ANKLE ARTHROPLASTY    . CHOLECYSTECTOMY    . FOOT SURGERY    . KNEE ARTHROPLASTY    . OOPHORECTOMY     Social History   Social History Narrative  . No narrative on file     Objective: Vital Signs: BP 128/78   Pulse 78   Resp 16   Ht '5\' 8"'  (1.727 m)   Wt 240 lb (108.9 kg)   BMI 36.49 kg/m    Physical Exam  Constitutional: She is oriented to person, place, and time. She appears well-developed and well-nourished.  HENT:  Head: Normocephalic and atraumatic.  Eyes: Conjunctivae and EOM are normal.  Neck: Normal range of motion.  Cardiovascular: Normal rate, regular rhythm, normal heart sounds and intact distal pulses.   Pulmonary/Chest: Effort normal and breath sounds normal.  Abdominal: Soft. Bowel sounds are normal.  Lymphadenopathy:    She has no cervical adenopathy.  Neurological: She is alert and oriented to person, place, and time.  Skin: Skin is warm and dry. Capillary refill takes less than 2 seconds.  Psychiatric: She has a normal mood and affect. Her behavior is normal.  Nursing note and vitals reviewed.    Musculoskeletal Exam: C-spine and thoracic lumbar spine good  range of motion. Shoulder joints elbow joints wrist joint MCPs PIPs DIPs are good range of motion with no synovitis. She had tenderness on palpation over right lateral epicondyle area. She had tenderness over bilateral trochanteric bursa area. Although joints afford range of motion with no synovitis.  CDAI Exam: No CDAI exam completed.    Investigation: No additional findings. CBC Latest Ref Rng & Units 02/11/2017  WBC 3.8 - 10.8 K/uL 7.2  Hemoglobin 11.7 - 15.5 g/dL 13.5  Hematocrit 35.0 - 45.0 % 39.3  Platelets 140 - 400 K/uL 277   CMP     Component Value Date/Time   NA 144 02/11/2017 0920   K 4.2 02/11/2017 0920   CL 109 02/11/2017 0920   CO2 21 02/11/2017 0920   GLUCOSE 102 (H) 02/11/2017 0920   BUN 14 02/11/2017 0920   CREATININE 0.76 02/11/2017 0920   CALCIUM 9.2 02/11/2017 0920   PROT 6.7 02/11/2017 0920   ALBUMIN 4.1 02/11/2017 0920   AST 17 02/11/2017 0920   ALT 20 02/11/2017 0920   ALKPHOS 81 02/11/2017 0920   BILITOT 0.3 02/11/2017 0920   GFRNONAA >89 02/11/2017 0920   GFRAA >89 02/11/2017 0920  ESR 13, SPEP normal, RF negative, CCP antibody negative, Ace negative, HLA-B27 negative ANA 1:160NH,  Imaging: No results found.  Speciality Comments: No specialty comments available.    Procedures:  No procedures performed Allergies: Tramadol and Penicillins   Assessment / Plan:     Visit Diagnoses: Primary osteoarthritis of both knees - Moderate with moderate chondromalacia patella. She continues to have pain and discomfort in her bilateral knee joints. Weight loss muscle strengthening was discussed.  Primary osteoarthritis of both feet: Proper fitting shoes were discussed.  Lateral epicondylitis of right elbow: She continues to have ongoing pain and discomfort. I offered physical therapy which she declined. I will give her a prescription for Voltaren gel which can be used topically. Indications objects contraindications were discussed.  Trochanteric bursitis  of both hips: She's been having ongoing discomfort. Have given her a handout on trochanteric bursae exercises. Some of them were demonstrated in the office.  Plantar fasciitis, bilateral: Doing better  Neuralgia in bilateral feet - Started Cymbalta last visit. She had some improvement. I will increase her Cymbalta to 60 mg by mouth daily.  DDD (degenerative disc disease), lumbar - Mild: Chronic pain  Positive ANA (antinuclear antibody) - 1:160NH . She complains of fatigue and arthralgias. I will obtain ENA C3-C4, beta-2 anticardiolipin and lupus anticoagulant. I will also obtain CK and TSH due to ongoing myalgias.   Orders: Orders Placed This Encounter  Procedures  . Anti-DNA antibody, double-stranded  . Anti-scleroderma antibody  . Sjogrens syndrome-A extractable nuclear antibody  . Sjogrens syndrome-B extractable nuclear antibody  . Anti-Smith antibody  . RNP Antibody  . C3 and C4  . Beta-2 glycoprotein antibodies  . Cardiolipin antibodies, IgG, IgM, IgA  . Lupus Anticoagulant Eval w/Reflex  . VITAMIN D 25 Hydroxy (Vit-D Deficiency, Fractures)  . TSH  . CK   Meds ordered this encounter  Medications  . diclofenac sodium (VOLTAREN) 1 % GEL  Sig: Apply 4 g topically 3 (three) times daily as needed. Apply to bilateral knees up to threes times daily.    Dispense:  3 Tube    Refill:  3  . DULoxetine (CYMBALTA) 60 MG capsule    Sig: Take 1 capsule (60 mg total) by mouth daily.    Dispense:  90 capsule    Refill:  1    Face-to-face time spent with patient was 30 minutes. Greater than 50% of time was spent in counseling and coordination of care.  Follow-Up Instructions: Return in about 3 months (around 06/18/2017) for OA DDD.   Bo Merino, MD  Note - This record has been created using Editor, commissioning.  Chart creation errors have been sought, but may not always  have been located. Such creation errors do not reflect on  the standard of medical care.

## 2017-03-19 ENCOUNTER — Encounter (INDEPENDENT_AMBULATORY_CARE_PROVIDER_SITE_OTHER): Payer: Self-pay

## 2017-03-19 ENCOUNTER — Encounter: Payer: Self-pay | Admitting: Rheumatology

## 2017-03-19 ENCOUNTER — Ambulatory Visit (INDEPENDENT_AMBULATORY_CARE_PROVIDER_SITE_OTHER): Payer: BLUE CROSS/BLUE SHIELD | Admitting: Rheumatology

## 2017-03-19 VITALS — BP 128/78 | HR 78 | Resp 16 | Ht 68.0 in | Wt 240.0 lb

## 2017-03-19 DIAGNOSIS — M7711 Lateral epicondylitis, right elbow: Secondary | ICD-10-CM | POA: Diagnosis not present

## 2017-03-19 DIAGNOSIS — M170 Bilateral primary osteoarthritis of knee: Secondary | ICD-10-CM | POA: Diagnosis not present

## 2017-03-19 DIAGNOSIS — M2550 Pain in unspecified joint: Secondary | ICD-10-CM

## 2017-03-19 DIAGNOSIS — R768 Other specified abnormal immunological findings in serum: Secondary | ICD-10-CM | POA: Diagnosis not present

## 2017-03-19 DIAGNOSIS — M19071 Primary osteoarthritis, right ankle and foot: Secondary | ICD-10-CM

## 2017-03-19 DIAGNOSIS — Z1321 Encounter for screening for nutritional disorder: Secondary | ICD-10-CM

## 2017-03-19 DIAGNOSIS — R5383 Other fatigue: Secondary | ICD-10-CM | POA: Diagnosis not present

## 2017-03-19 DIAGNOSIS — M792 Neuralgia and neuritis, unspecified: Secondary | ICD-10-CM | POA: Diagnosis not present

## 2017-03-19 DIAGNOSIS — M19072 Primary osteoarthritis, left ankle and foot: Secondary | ICD-10-CM

## 2017-03-19 DIAGNOSIS — M5136 Other intervertebral disc degeneration, lumbar region: Secondary | ICD-10-CM

## 2017-03-19 DIAGNOSIS — M7061 Trochanteric bursitis, right hip: Secondary | ICD-10-CM

## 2017-03-19 DIAGNOSIS — M722 Plantar fascial fibromatosis: Secondary | ICD-10-CM | POA: Diagnosis not present

## 2017-03-19 DIAGNOSIS — M7062 Trochanteric bursitis, left hip: Secondary | ICD-10-CM

## 2017-03-19 MED ORDER — DICLOFENAC SODIUM 1 % TD GEL: 4.0000 g | Freq: Three times a day (TID) | TRANSDERMAL | 3 refills | Status: DC | PRN

## 2017-03-19 MED ORDER — DULOXETINE HCL 60 MG PO CPEP: 60.0000 mg | ORAL_CAPSULE | Freq: Every day | ORAL | 1 refills | Status: DC

## 2017-03-19 NOTE — Patient Instructions (Addendum)
Back Exercises The following exercises strengthen the muscles that help to support the back. They also help to keep the lower back flexible. Doing these exercises can help to prevent back pain or lessen existing pain. If you have back pain or discomfort, try doing these exercises 2-3 times each day or as told by your health care provider. When the pain goes away, do them once each day, but increase the number of times that you repeat the steps for each exercise (do more repetitions). If you do not have back pain or discomfort, do these exercises once each day or as told by your health care provider. Exercises Single Knee to Chest  Repeat these steps 3-5 times for each leg: 1. Lie on your back on a firm bed or the floor with your legs extended. 2. Bring one knee to your chest. Your other leg should stay extended and in contact with the floor. 3. Hold your knee in place by grabbing your knee or thigh. 4. Pull on your knee until you feel a gentle stretch in your lower back. 5. Hold the stretch for 10-30 seconds. 6. Slowly release and straighten your leg.  Pelvic Tilt  Repeat these steps 5-10 times: 1. Lie on your back on a firm bed or the floor with your legs extended. 2. Bend your knees so they are pointing toward the ceiling and your feet are flat on the floor. 3. Tighten your lower abdominal muscles to press your lower back against the floor. This motion will tilt your pelvis so your tailbone points up toward the ceiling instead of pointing to your feet or the floor. 4. With gentle tension and even breathing, hold this position for 5-10 seconds.  Cat-Cow  Repeat these steps until your lower back becomes more flexible: 1. Get into a hands-and-knees position on a firm surface. Keep your hands under your shoulders, and keep your knees under your hips. You may place padding under your knees for comfort. 2. Let your head hang down, and point your tailbone toward the floor so your lower back  becomes rounded like the back of a cat. 3. Hold this position for 5 seconds. 4. Slowly lift your head and point your tailbone up toward the ceiling so your back forms a sagging arch like the back of a cow. 5. Hold this position for 5 seconds.  Press-Ups  Repeat these steps 5-10 times: 1. Lie on your abdomen (face-down) on the floor. 2. Place your palms near your head, about shoulder-width apart. 3. While you keep your back as relaxed as possible and keep your hips on the floor, slowly straighten your arms to raise the top half of your body and lift your shoulders. Do not use your back muscles to raise your upper torso. You may adjust the placement of your hands to make yourself more comfortable. 4. Hold this position for 5 seconds while you keep your back relaxed. 5. Slowly return to lying flat on the floor.  Bridges  Repeat these steps 10 times: 1. Lie on your back on a firm surface. 2. Bend your knees so they are pointing toward the ceiling and your feet are flat on the floor. 3. Tighten your buttocks muscles and lift your buttocks off of the floor until your waist is at almost the same height as your knees. You should feel the muscles working in your buttocks and the back of your thighs. If you do not feel these muscles, slide your feet 1-2 inches farther away   from your buttocks. 4. Hold this position for 3-5 seconds. 5. Slowly lower your hips to the starting position, and allow your buttocks muscles to relax completely.  If this exercise is too easy, try doing it with your arms crossed over your chest. Abdominal Crunches  Repeat these steps 5-10 times: 1. Lie on your back on a firm bed or the floor with your legs extended. 2. Bend your knees so they are pointing toward the ceiling and your feet are flat on the floor. 3. Cross your arms over your chest. 4. Tip your chin slightly toward your chest without bending your neck. 5. Tighten your abdominal muscles and slowly raise your  trunk (torso) high enough to lift your shoulder blades a tiny bit off of the floor. Avoid raising your torso higher than that, because it can put too much stress on your low back and it does not help to strengthen your abdominal muscles. 6. Slowly return to your starting position.  Back Lifts Repeat these steps 5-10 times: 1. Lie on your abdomen (face-down) with your arms at your sides, and rest your forehead on the floor. 2. Tighten the muscles in your legs and your buttocks. 3. Slowly lift your chest off of the floor while you keep your hips pressed to the floor. Keep the back of your head in line with the curve in your back. Your eyes should be looking at the floor. 4. Hold this position for 3-5 seconds. 5. Slowly return to your starting position.  Contact a health care provider if:  Your back pain or discomfort gets much worse when you do an exercise.  Your back pain or discomfort does not lessen within 2 hours after you exercise. If you have any of these problems, stop doing these exercises right away. Do not do them again unless your health care provider says that you can. Get help right away if:  You develop sudden, severe back pain. If this happens, stop doing the exercises right away. Do not do them again unless your health care provider says that you can. This information is not intended to replace advice given to you by your health care provider. Make sure you discuss any questions you have with your health care provider. Document Released: 07/19/2004 Document Revised: 10/19/2015 Document Reviewed: 08/05/2014 Elsevier Interactive Patient Education  2017 [place] Ask your health care provider which exercises are safe for you. Do exercises exactly as told by your health care provider and adjust them as directed. It is normal to feel mild stretching, pulling, tightness, or discomfort as you do these exercises, but you should stop right away if you  feel sudden pain or your pain gets worse.Do not begin these exercises until told by your health care provider. Stretching and range of motion exercises These exercises warm up your muscles and joints and improve the movement and flexibility of your leg. These exercises also help to relieve pain and stiffness. Exercise A: Quadriceps stretch, prone  1. Lie on your abdomen on a firm surface, such as a bed or padded floor. 2. Bend your left / right knee and hold your ankle. If you cannot reach your ankle or pant leg, loop a belt around your foot and grab the belt instead. 3. Gently pull your heel toward your buttocks. Your knee should not slide out to the side. You should feel a stretch in the front of your thigh and knee. 4. Hold this position for __________ seconds. Repeat __________  times. Complete this exercise __________ times a day. Exercise B: Lunge ( adductor stretch) 1. Stand and spread your legs about 3 feet (about 1 m) apart. Put your left / right leg slightly back for balance. 2. Lean away from your left / right leg by bending your other knee and shifting your weight toward your bent knee. You may rest your hands on your thigh for balance. You should feel a stretch in your left / right inner thigh. 3. Hold for __________ seconds. Repeat __________ times. Complete this exercise __________ times a day. Exercise C: Hamstring stretch, supine  1. Lie on your back. 2. Hold both ends of a belt or towel as you loop it over the ball of your left / right foot. The ball of your foot is on the walking surface, right under your toes. 3. Straighten your left / right knee and slowly pull on the belt to raise your leg. Stop when you feel a gentle stretch in the back of your left / right knee or thigh. ? Do not let your left / right knee bend. ? Keep your other leg flat on the floor. 4. Hold this position for __________ seconds. Repeat __________ times. Complete this exercise __________ times a  day. Strengthening exercises These exercises build strength and endurance in your leg. Endurance is the ability to use your muscles for a long time, even after they get tired. Exercise D: Quadriceps wall slides  1. Lean your back against a smooth wall or door while you walk your feet out 18-24 inches (46-61 cm) from it. 2. Place your feet hip-width apart. 3. Slowly slide down the wall or door until your knees bend as far as told by your health care provider. Keep your knees over your heels, not your toes. Keep your knees in line with your hips. 4. Hold for __________ seconds. 5. Push through your heels to stand up to rest for __________ seconds after each repetition. Repeat __________ times. Complete this exercise __________ times a day. Exercise E: Straight leg raises ( hip abductors) 1. Lie on your side, with your left / right leg in the top position. Lie so your head, shoulder, knee, and hip line up with each other. You may bend your bottom knee to help you balance. 2. Lift your top leg 4-6 inches (10-15 cm) while keeping your toes pointed straight ahead. 3. Hold this position for __________ seconds. 4. Slowly lower your leg to the starting position. Allow your muscles to relax completely after each repetition. Repeat __________ times. Complete this exercise __________ times a day. Exercise F: Straight leg raises ( hip extensors) 1. Lie on your abdomen on a firm surface. You can put a pillow under your hips if that is more comfortable. 2. Tense the muscles in your buttocks and lift your left / right leg about 4-6 inches (10-15 cm). Keep your knee straight as you lift your leg. 3. Hold this position for __________ seconds. 4. Slowly lower your leg to the starting position. 5. Let your leg relax completely after each repetition. Repeat __________ times. Complete this exercise __________ times a day. Exercise G: Bridge ( hip extensors) 1. Lie on your back on a firm surface with your knees  bent and your feet flat on the floor. 2. Tighten your buttocks muscles and lift your bottom off the floor until your trunk is level with your thighs. ? Do not arch your back. ? You should feel the muscles working in your buttocks and  the back of your thighs. If you do not feel these muscles, slide your feet 1-2 inches (2.5-5 cm) farther away from your buttocks. 3. Hold this position for __________ seconds. 4. Slowly lower your hips to the starting position. 5. Let your buttocks muscles relax completely between repetitions. 6. If this exercise is too easy, try doing it with your arms crossed over your chest. Repeat __________ times. Complete this exercise __________ times a day. This information is not intended to replace advice given to you by your health care provider. Make sure you discuss any questions you have with your health care provider. Document Released: 06/11/2005 Document Revised: 02/16/2016 Document Reviewed: 05/24/2015 Elsevier Interactive Patient Education  [employer].

## 2017-03-20 ENCOUNTER — Telehealth: Payer: Self-pay | Admitting: *Deleted

## 2017-03-20 DIAGNOSIS — E559 Vitamin D deficiency, unspecified: Secondary | ICD-10-CM

## 2017-03-20 MED ORDER — VITAMIN D (ERGOCALCIFEROL) 1.25 MG (50000 UNIT) PO CAPS: 50000.0000 [IU] | ORAL_CAPSULE | ORAL | 0 refills | Status: DC

## 2017-03-20 NOTE — Telephone Encounter (Signed)
-----   Message from Bo Merino, MD sent at 03/20/2017  8:52 AM EDT ----- Vitamin D 50,000 units one a week for 90 days. Repeat vitamin D level in 3 months.

## 2017-03-20 NOTE — Progress Notes (Signed)
Vitamin D 50,000 units one a week for 90 days. Repeat vitamin D level in 3 months.

## 2017-03-22 LAB — TSH: TSH: 1.2 mIU/L

## 2017-03-22 LAB — CARDIOLIPIN ANTIBODIES, IGG, IGM, IGA: Anticardiolipin IgM: <12 [MPL'U]

## 2017-03-22 LAB — LUPUS ANTICOAGULANT EVAL W/ REFLEX
DRVVT SCREEN: 38 s (ref <=45)
PTT LA SCREEN: 28 s (ref <=40)

## 2017-03-22 LAB — C3 AND C4
C3 Complement: 153 mg/dL (ref 83–193)
C4 COMPLEMENT: 34 mg/dL (ref 15–57)

## 2017-03-22 LAB — SJOGRENS SYNDROME-A EXTRACTABLE NUCLEAR ANTIBODY: SSA (Ro) (ENA) Antibody, IgG: <1 AI

## 2017-03-22 LAB — RNP ANTIBODY: Ribonucleic Protein(ENA) Antibody, IgG: <1 AI

## 2017-03-22 LAB — SJOGRENS SYNDROME-B EXTRACTABLE NUCLEAR ANTIBODY: SSB (La) (ENA) Antibody, IgG: <1 AI

## 2017-03-22 LAB — CK: CK TOTAL: 51 U/L (ref 29–143)

## 2017-03-22 LAB — ANTI-SMITH ANTIBODY: ENA SM AB SER-ACNC: NEGATIVE AI

## 2017-03-22 LAB — BETA-2 GLYCOPROTEIN ANTIBODIES

## 2017-03-22 LAB — VITAMIN D 25 HYDROXY (VIT D DEFICIENCY, FRACTURES): VIT D 25 HYDROXY: 28 ng/mL — AB (ref 30–100)

## 2017-03-22 LAB — ANTI-DNA ANTIBODY, DOUBLE-STRANDED: ds DNA Ab: 1 IU/mL

## 2017-03-22 LAB — ANTI-SCLERODERMA ANTIBODY: SCLERODERMA (SCL-70) (ENA) ANTIBODY, IGG: NEGATIVE AI

## 2017-03-25 ENCOUNTER — Telehealth: Payer: Self-pay | Admitting: Rheumatology

## 2017-03-25 DIAGNOSIS — M7711 Lateral epicondylitis, right elbow: Secondary | ICD-10-CM

## 2017-03-25 NOTE — Telephone Encounter (Signed)
Per patient woke up, and now having trouble straightening arm due to elbow . Patient having weakness, pain, trouble gripping with arm. Please call patient to discuss. Pain has been waking her up this weekend at night. Patient did discuss elbow with Dr. Estanislado Pandy at last appt  9/25. She has been using Voltaren Gel, and wearing brace.

## 2017-03-25 NOTE — Telephone Encounter (Signed)
Physical therapy is the best treatment and first recommendation. If she does not want physical therapy we can do the injection.

## 2017-03-25 NOTE — Telephone Encounter (Signed)
Attempted to contact the patient and left message for patient to call the office.  

## 2017-03-25 NOTE — Telephone Encounter (Signed)
Patient states the pain in her elbow has continually gotten worse. Patient states to use it hurts. Patient states it hurts to bend it or straighten it. Patient states she has been using the Voltaren Gel. Patient states that isn't helping much now. Patient states it is interrupting her sleep and it is hard for her to grip or lift something as simple as a coffee cup. Lateral epicondylitis of right elbow is what she was seen for on 03/19/17 and she is wondering about possibly having a cortisone injection. She did deny physical therapy at that appointment.

## 2017-03-26 NOTE — Telephone Encounter (Signed)
Patient advised that physical therapy is the best treatment and recommended first. Patient is going to contact her insurance company to make sure it is covered and will call back for referral or to schedule injection based off the information she is provided.

## 2017-03-26 NOTE — Addendum Note (Signed)
Addended by: Carole Binning on: 03/26/2017 10:14 AM   Modules accepted: Orders

## 2017-03-26 NOTE — Telephone Encounter (Signed)
Patient returned call and states insurance company will cover physical therapy. Will place referral.

## 2017-03-29 ENCOUNTER — Telehealth: Payer: Self-pay | Admitting: Rheumatology

## 2017-03-29 NOTE — Telephone Encounter (Signed)
Patient advised referral re-faxed with the requested information. Patient advised to go see PCP, urgent care or orthopedics for evaluation of and x-ray. Patient verbalized understanding.

## 2017-03-29 NOTE — Telephone Encounter (Signed)
Patient may go to PCP or orthopedics or urgent care for evaluation and x-ray

## 2017-03-29 NOTE — Telephone Encounter (Signed)
Patient states [facility] PT called to schedule appts, and informed her rx needs to be OT not PT for billing purposes. Please fax new referral to Attn. Beth Fax# [phone].  Patient also wants you to know she actually had an injury to elbow Sept 3rd where she hit elbow on stair rail, and was wondering if she may need an xray of elbow. Pain started then, and has continued since. Please call patient to advise.

## 2017-06-11 ENCOUNTER — Other Ambulatory Visit: Payer: Self-pay | Admitting: Rheumatology

## 2017-06-11 ENCOUNTER — Encounter: Payer: Self-pay | Admitting: Podiatry

## 2017-06-11 NOTE — Telephone Encounter (Signed)
Patient advised we will need to recheck her Vitamin D before refilling. Patient has an appointment 06/26/17 and will have it check at this time.

## 2017-06-26 ENCOUNTER — Ambulatory Visit: Payer: BLUE CROSS/BLUE SHIELD | Admitting: Rheumatology

## 2017-06-26 ENCOUNTER — Inpatient Hospital Stay (INDEPENDENT_AMBULATORY_CARE_PROVIDER_SITE_OTHER): Payer: Self-pay

## 2017-06-26 DIAGNOSIS — M79672 Pain in left foot: Secondary | ICD-10-CM

## 2017-06-26 DIAGNOSIS — M79671 Pain in right foot: Secondary | ICD-10-CM

## 2017-06-26 DIAGNOSIS — E559 Vitamin D deficiency, unspecified: Secondary | ICD-10-CM

## 2017-06-27 ENCOUNTER — Telehealth: Payer: Self-pay | Admitting: *Deleted

## 2017-06-27 DIAGNOSIS — E559 Vitamin D deficiency, unspecified: Secondary | ICD-10-CM

## 2017-06-27 LAB — VITAMIN D 25 HYDROXY (VIT D DEFICIENCY, FRACTURES): VIT D 25 HYDROXY: 29 ng/mL — AB (ref 30–100)

## 2017-06-27 MED ORDER — VITAMIN D (ERGOCALCIFEROL) 1.25 MG (50000 UNIT) PO CAPS: 50000.0000 [IU] | ORAL_CAPSULE | ORAL | 0 refills | Status: DC

## 2017-06-27 NOTE — Telephone Encounter (Signed)
-----   Message from Bo Merino, MD sent at 06/27/2017  8:33 AM EST ----- Call in VitD 50,000 U twice a week for 3 months. Repeat labs in 3 months.

## 2017-06-27 NOTE — Progress Notes (Signed)
Call in VitD 50,000 U twice a week for 3 months. Repeat labs in 3 months.

## 2017-07-15 ENCOUNTER — Other Ambulatory Visit: Payer: BLUE CROSS/BLUE SHIELD

## 2017-07-29 ENCOUNTER — Ambulatory Visit
Admission: RE | Admit: 2017-07-29 | Discharge: 2017-07-29 | Disposition: A | Discharge status: Home / Self Care | Payer: BLUE CROSS/BLUE SHIELD | Source: Ambulatory Visit | Attending: Obstetrics and Gynecology | Admitting: Obstetrics and Gynecology

## 2017-07-29 ENCOUNTER — Other Ambulatory Visit: Payer: Self-pay | Admitting: Obstetrics and Gynecology

## 2017-07-29 DIAGNOSIS — N6489 Other specified disorders of breast: Secondary | ICD-10-CM

## 2017-07-29 DIAGNOSIS — N6001 Solitary cyst of right breast: Secondary | ICD-10-CM

## 2017-08-21 NOTE — Progress Notes (Deleted)
Office Visit Note  Patient: Bethany Johns. Gorst             Date of Birth: [birth year]           MRN: [id number]             PCP: Aretta Nip, MD Referring: Elesa Massed, NP Visit Date: 09/03/2017 Occupation: @GUAROCC @    Subjective:  No chief complaint on file.   History of Present Illness: Bethany Johns is a 52 y.o. female ***   Activities of Daily Living:  Patient reports morning stiffness for *** {minute/hour:19697}.   Patient {ACTIONS;DENIES/REPORTS:21021675::"Denies"} nocturnal pain.  Difficulty dressing/grooming: {ACTIONS;DENIES/REPORTS:21021675::"Denies"} Difficulty climbing stairs: {ACTIONS;DENIES/REPORTS:21021675::"Denies"} Difficulty getting out of chair: {ACTIONS;DENIES/REPORTS:21021675::"Denies"} Difficulty using hands for taps, buttons, cutlery, and/or writing: {ACTIONS;DENIES/REPORTS:21021675::"Denies"}   No Rheumatology ROS completed.   PMFS History:  Patient Active Problem List   Diagnosis Date Noted  . Primary osteoarthritis of both feet 03/18/2017  . DDD (degenerative disc disease), lumbar 03/18/2017  . Plantar fasciitis, bilateral 02/11/2017  . Primary osteoarthritis of both knees 02/11/2017  . Gallstones 06/22/2013    Past Medical History:  Diagnosis Date  . Arthritis    both knees  . Arthritis   . Back pain    d/t sitting at a computer all day  . History of bronchitis    about 52yrs ago  . Pneumonia    as a child  . Vaginal delivery 1998    Family History  Problem Relation Age of Onset  . Breast cancer Maternal Grandmother 71  . Cancer Father        bladder   Past Surgical History:  Procedure Laterality Date  . ABLATION    . ANKLE ARTHROPLASTY    . ANKLE SURGERY Right    torn ligament  . CHOLECYSTECTOMY N/A 06/26/2013   Procedure: LAPAROSCOPIC CHOLECYSTECTOMY WITH INTRAOPERATIVE CHOLANGIOGRAM, POSSIBLE OPEN;  Surgeon: [name], MD;  Location: [facility];  Service: General;  Laterality: N/A;  . CHOLECYSTECTOMY    . FOOT  SURGERY     PLANTAR FASCIITIS  . FOOT SURGERY    . KNEE ARTHROPLASTY    . KNEE ARTHROSCOPY Right 08/2012  . LAPAROSCOPIC SALPINGO OOPHERECTOMY Left 06/15/2015   Procedure: LAPAROSCOPIC LEFT SALPINGO OOPHORECTOMY;  Surgeon: Paula Compton, MD;  Location: [facility];  Service: Gynecology;  Laterality: Left;  . LASIK     eye surgery  . NOVASURE ABLATION N/A 06/15/2015   Procedure: NOVASURE ABLATION;  Surgeon: Paula Compton, MD;  Location: [facility];  Service: Gynecology;  Laterality: N/A;  . OOPHORECTOMY     Social History   Social History Narrative   ** Merged History Encounter **         Objective: Vital Signs: There were no vitals taken for this visit.   Physical Exam   Musculoskeletal Exam: ***  CDAI Exam: No CDAI exam completed.    Investigation: No additional findings.   Imaging: US Breast Ltd Uni Right Inc Axilla  Result Date: 07/29/2017 CLINICAL DATA:  Follow-up probably benign asymmetry right breast identified the screening mammogram July 2018, and evaluated with diagnostic mammogram ultrasound. EXAM: 2D DIGITAL DIAGNOSTIC RIGHT MAMMOGRAM WITH CAD AND ADJUNCT TOMO ULTRASOUND RIGHT BREAST COMPARISON:  July 2018 ACR Breast Density Category b: There are scattered areas of fibroglandular density. FINDINGS: An asymmetry in the medial right breast is mammographically stable. No suspicious mass, distortion, or suspicious microcalcification is identified. Mammographic images were processed with CAD. Targeted ultrasound is performed, showing two small adjacent simple cysts  at 2:30 position measuring 0.3 and 0.2 cm in size, respectively. These cysts are within a focal island of glandular tissue. It is possible they may account for the stable asymmetry seen mammographically. Ultrasound of the known complicated cyst at 8:85 position 2 cm from the nipple is performed. This cyst measures 0.6 x 0.5 x 0.2 cm, without significant change from the ultrasound of July 2018. IMPRESSION: Stable  probably benign asymmetry in the right breast and benign cysts in the right breast as described above. RECOMMENDATION: Bilateral diagnostic mammogram is recommended in July 2019 to complete a 1 year follow-up. I have discussed the findings and recommendations with the patient. Results were also provided in writing at the conclusion of the visit. If applicable, a reminder letter will be sent to the patient regarding the next appointment. BI-RADS CATEGORY  3: Probably benign. Electronically Signed   By: Curlene Dolphin M.D.   On: 07/29/2017 16:26   Mm Diag Breast Tomo Uni Right  Result Date: 07/29/2017 CLINICAL DATA:  Follow-up probably benign asymmetry right breast identified the screening mammogram July 2018, and evaluated with diagnostic mammogram ultrasound. EXAM: 2D DIGITAL DIAGNOSTIC RIGHT MAMMOGRAM WITH CAD AND ADJUNCT TOMO ULTRASOUND RIGHT BREAST COMPARISON:  July 2018 ACR Breast Density Category b: There are scattered areas of fibroglandular density. FINDINGS: An asymmetry in the medial right breast is mammographically stable. No suspicious mass, distortion, or suspicious microcalcification is identified. Mammographic images were processed with CAD. Targeted ultrasound is performed, showing two small adjacent simple cysts at 2:30 position measuring 0.3 and 0.2 cm in size, respectively. These cysts are within a focal island of glandular tissue. It is possible they may account for the stable asymmetry seen mammographically. Ultrasound of the known complicated cyst at 0:27 position 2 cm from the nipple is performed. This cyst measures 0.6 x 0.5 x 0.2 cm, without significant change from the ultrasound of July 2018. IMPRESSION: Stable probably benign asymmetry in the right breast and benign cysts in the right breast as described above. RECOMMENDATION: Bilateral diagnostic mammogram is recommended in July 2019 to complete a 1 year follow-up. I have discussed the findings and recommendations with the patient.  Results were also provided in writing at the conclusion of the visit. If applicable, a reminder letter will be sent to the patient regarding the next appointment. BI-RADS CATEGORY  3: Probably benign. Electronically Signed   By: Curlene Dolphin M.D.   On: 07/29/2017 16:26    Speciality Comments: No specialty comments available.    Procedures:  No procedures performed Allergies: Tramadol; Tramadol; Amoxicillin; and Penicillins   Assessment / Plan:     Visit Diagnoses: No diagnosis found.    Orders: No orders of the defined types were placed in this encounter.  No orders of the defined types were placed in this encounter.   Face-to-face time spent with patient was *** minutes. 50% of time was spent in counseling and coordination of care.  Follow-Up Instructions: No Follow-up on file.   Earnestine Mealing, CMA  Note - This record has been created using Editor, commissioning.  Chart creation errors have been sought, but may not always  have been located. Such creation errors do not reflect on  the standard of medical care.

## 2017-09-03 ENCOUNTER — Ambulatory Visit: Payer: BLUE CROSS/BLUE SHIELD | Admitting: Rheumatology

## 2017-09-14 ENCOUNTER — Other Ambulatory Visit: Payer: Self-pay | Admitting: Rheumatology

## 2017-09-16 ENCOUNTER — Telehealth: Payer: Self-pay | Admitting: Rheumatology

## 2017-09-16 NOTE — Telephone Encounter (Signed)
-----   Message from Carole Binning, LPN sent at 9/48/0165 10:38 AM EDT ----- Regarding: Please schedule follow up visit Please schedule follow up visit. Patient was due December 2018. Thanks!

## 2017-09-16 NOTE — Telephone Encounter (Signed)
LMOM for patient to call and schedule a follow up appointment

## 2017-09-16 NOTE — Telephone Encounter (Signed)
Last Visit: 03/19/17 Next Visit was due December 2018. Message sent to the front to schedule patient.  Okay to refill 30 day supply per Dr. Estanislado Pandy

## 2017-09-21 ENCOUNTER — Other Ambulatory Visit: Payer: Self-pay | Admitting: Rheumatology

## 2017-09-23 ENCOUNTER — Other Ambulatory Visit: Payer: Self-pay | Admitting: Rheumatology

## 2017-09-23 DIAGNOSIS — E559 Vitamin D deficiency, unspecified: Secondary | ICD-10-CM

## 2017-09-23 NOTE — Telephone Encounter (Signed)
Patient advised she will need labs rechecked before refill.

## 2017-09-24 LAB — VITAMIN D 25 HYDROXY (VIT D DEFICIENCY, FRACTURES): VIT D 25 HYDROXY: 39 ng/mL (ref 30–100)

## 2017-09-24 NOTE — Progress Notes (Signed)
Vitamin D is better.  She may take vitamin D 50,000 units once a month or vitamin D 2000 units over-the-counter daily.

## 2017-10-22 DIAGNOSIS — K219 Gastro-esophageal reflux disease without esophagitis: Secondary | ICD-10-CM | POA: Insufficient documentation

## 2017-10-25 LAB — HM COLONOSCOPY

## 2017-11-13 ENCOUNTER — Other Ambulatory Visit: Payer: Self-pay | Admitting: Rheumatology

## 2017-11-13 ENCOUNTER — Telehealth: Payer: Self-pay | Admitting: Rheumatology

## 2017-11-13 NOTE — Telephone Encounter (Signed)
Patient called requesting prescription refill of Duloxetine.  Patient's pharmacy is [facility] in [place].

## 2017-11-13 NOTE — Telephone Encounter (Signed)
Last visit: 03/19/2017 Next visit: 11/19/2017  Okay to refill cymbalta?

## 2017-11-14 MED ORDER — DULOXETINE HCL 60 MG PO CPEP: ORAL_CAPSULE | ORAL | 0 refills | Status: DC

## 2017-11-14 NOTE — Progress Notes (Signed)
Office Visit Note  Patient: Bethany Johns. [place]             Date of Birth: 1965/11/18           MRN: [id number]             PCP: Aretta Nip, MD Referring: Elesa Massed, NP Visit Date: 11/19/2017 Occupation: @GUAROCC @    Subjective:  Pain in bilateral knee joints    History of Present Illness: Bethany Johns. Bethany Johns is a 52 y.o. female with history of osteoarthritis and DDD.  Patient states her pain has improved since starting on Cymbalta 60 mg po daily and taking natural anti-inflammatories.  She uses Voltaren gel very sparingly.  She states that right lateral epicondylitis and bilateral plantar fasciitis have resolved.  She states she continues to have chronic pain in bilateral knee joints.  She states she occasionally has knee joint swelling.  She has also noticed fluid retention in bilateral ankles.  She continues to have pain in bilateral feet but denies any joint swelling.  She experiences some numbness in her left 2nd toe.  She states her lower back pain has improved but she still has pain and stiffness first thing in the morning.  She states that the bilateral trochanteric bursitis has improved.  She takes Excedrin at bedtime to help with pain relief.     Activities of Daily Living:  Patient reports morning stiffness for 5 minutes.   Patient Reports nocturnal pain.  Difficulty dressing/grooming: Denies Difficulty climbing stairs: Reports Difficulty getting out of chair: Reports Difficulty using hands for taps, buttons, cutlery, and/or writing: Denies   Review of Systems  Constitutional: Positive for fatigue.  HENT: Negative for mouth sores, mouth dryness and nose dryness.   Eyes: Negative for pain, visual disturbance and dryness.  Respiratory: Negative for cough, hemoptysis, shortness of breath and difficulty breathing.   Cardiovascular: Negative for chest pain, palpitations, hypertension and swelling in legs/feet.  Gastrointestinal: Negative for abdominal pain, blood in  stool, constipation and diarrhea.  Endocrine: Negative for increased urination.  Genitourinary: Negative for painful urination and pelvic pain.  Musculoskeletal: Positive for joint swelling and morning stiffness. Negative for arthralgias, joint pain, myalgias, muscle weakness, muscle tenderness and myalgias.  Skin: Positive for rash. Negative for color change, pallor, hair loss, nodules/bumps, skin tightness, ulcers and sensitivity to sunlight.  Allergic/Immunologic: Negative for susceptible to infections.  Neurological: Negative for dizziness, numbness, headaches, memory loss and weakness.  Hematological: Negative for swollen glands.  Psychiatric/Behavioral: Negative for depressed mood, confusion and sleep disturbance. The patient is not nervous/anxious.     PMFS History:  Patient Active Problem List   Diagnosis Date Noted  . Primary osteoarthritis of both feet 03/18/2017  . DDD (degenerative disc disease), lumbar 03/18/2017  . Plantar fasciitis, bilateral 02/11/2017  . Primary osteoarthritis of both knees 02/11/2017  . Gallstones 06/22/2013    Past Medical History:  Diagnosis Date  . Arthritis    both knees  . Arthritis   . Back pain    d/t sitting at a computer all day  . History of bronchitis    about 54yrs ago  . Pneumonia    as a child  . Vaginal delivery 1998    Family History  Problem Relation Age of Onset  . Breast cancer Maternal Grandmother 69  . Dementia Mother   . Cancer Father        bladder  . Healthy Daughter    Past Surgical History:  Procedure Laterality  Date  . ABLATION    . ANKLE ARTHROPLASTY    . ANKLE SURGERY Right    torn ligament  . CHOLECYSTECTOMY N/A 06/26/2013   Procedure: LAPAROSCOPIC CHOLECYSTECTOMY WITH INTRAOPERATIVE CHOLANGIOGRAM, POSSIBLE OPEN;  Surgeon: [name], MD;  Location: [facility];  Service: General;  Laterality: N/A;  . CHOLECYSTECTOMY    . FOOT SURGERY     PLANTAR FASCIITIS  . FOOT SURGERY    . KNEE ARTHROPLASTY    .  KNEE ARTHROSCOPY Right 08/2012  . LAPAROSCOPIC SALPINGO OOPHERECTOMY Left 06/15/2015   Procedure: LAPAROSCOPIC LEFT SALPINGO OOPHORECTOMY;  Surgeon: Paula Compton, MD;  Location: [facility];  Service: Gynecology;  Laterality: Left;  . LASIK     eye surgery  . NOVASURE ABLATION N/A 06/15/2015   Procedure: NOVASURE ABLATION;  Surgeon: Paula Compton, MD;  Location: [facility];  Service: Gynecology;  Laterality: N/A;  . OOPHORECTOMY     Social History   Social History Narrative   ** Merged History Encounter **         Objective: Vital Signs: BP 118/82 (BP Location: Left Arm, Patient Position: Sitting, Cuff Size: Large)   Pulse 75   Resp 16   Ht 5\' 9"  (1.753 m)   Wt 251 lb (113.9 kg)   BMI 37.07 kg/m    Physical Exam  Constitutional: She is oriented to person, place, and time. She appears well-developed and well-nourished.  HENT:  Head: Normocephalic and atraumatic.  Eyes: Conjunctivae and EOM are normal.  Neck: Normal range of motion.  Cardiovascular: Normal rate, regular rhythm, normal heart sounds and intact distal pulses.  Pulmonary/Chest: Effort normal and breath sounds normal.  Abdominal: Soft. Bowel sounds are normal.  Lymphadenopathy:    She has no cervical adenopathy.  Neurological: She is alert and oriented to person, place, and time.  Skin: Skin is warm and dry. Capillary refill takes less than 2 seconds.  Psychiatric: She has a normal mood and affect. Her behavior is normal.  Nursing note and vitals reviewed.    Musculoskeletal Exam: C-spine, thoracic spine, lumbar spine good range of motion.  No midline spinal tenderness.  No SI joint tenderness.  Shoulder joints, elbow joints, wrist joints, MCPs, PIPs, DIPs good range of motion with no synovitis.  Hip joints, knee joints, ankle joints, MTPs, PIPs, DIPs good range of motion with no synovitis.  No warmth or effusion of bilateral knee joints.  No tenderness of trochanteric bursa.  No tenderness of the plantar fascia  bilaterally.  CDAI Exam: No CDAI exam completed.    Investigation: No additional findings.   Imaging: No results found.  Speciality Comments: No specialty comments available.    Procedures:  No procedures performed Allergies: Tramadol; Tramadol; Amoxicillin; and Penicillins   Assessment / Plan:     Visit Diagnoses: Primary osteoarthritis of both knees - Moderate with moderate chondromalacia patella: No warmth or effusion on exam.  She has good range of motion of bilateral knee joints.  She has increased discomfort in bilateral knees.  She is very active and has discomfort when she is kneeling as well as climbing stairs.  She would like to apply for Visco supplementation of bilateral knees.  She has previously tried a series of Visco supplementation but does not recall the brand.  She occasionally uses Voltaren gel.  She also takes natural anti-inflammatories which help with her pain.  She takes Cymbalta 60 mg by mouth daily which has been helping with her pain but she would like to taper.  A prescription for Cymbalta 30 mg by mouth daily was sent to the pharmacy.  We discussed a tapering schedule today in the office.  Primary osteoarthritis of both feet: She has PIP and DIP synovial thickening consistent with osteoarthritis of both feet. She has no synovitis. She continues to have discomfort in bilateral feet.  We discussed the importance of wearing proper fitting shoes.    Trochanteric bursitis of both hips: Improved.  She has no tenderness on exam today. She has been performing exercises on a regular basis.     Lateral epicondylitis of right elbow: Resolved.  She has no tenderness on exam today.    Plantar fasciitis, bilateral: Resolved.  She has no tenderness of the plantar fascia on exam today.  Neuralgia in bilateral feet: Chronic.  Gabapentin did not help and she is no longer taking it.  Positive ANA (antinuclear antibody) - 1:160NH .  DDD (degenerative disc disease),  lumbar: No midline spinal tenderness.  Her lower back causes discomfort first thing in the morning.   Orders: No orders of the defined types were placed in this encounter.  No orders of the defined types were placed in this encounter.   Face-to-face time spent with patient was 30 minutes. >50% of time was spent in counseling and coordination of care.  Follow-Up Instructions: Return in about 6 months (around 05/22/2018) for DDD, plantar fasciitis , Osteoarthritis.   Ofilia Neas, PA-C I examined and evaluated the patient with Bethany Sams PA.  Patient had no synovitis on examination.  She continues to have a lot of discomfort in her knee joints.  We will try Visco supplement injections.  She also believes that Cymbalta is causing nightmares.  We discussed decreasing dose of Cymbalta.  The plan of care was discussed as noted above.  Bo Merino, MD Note - This record has been created using Editor, commissioning.  Chart creation errors have been sought, but may not always  have been located. Such creation errors do not reflect on  the standard of medical care.

## 2017-11-19 ENCOUNTER — Encounter: Payer: Self-pay | Admitting: Rheumatology

## 2017-11-19 ENCOUNTER — Ambulatory Visit: Payer: BLUE CROSS/BLUE SHIELD | Admitting: Rheumatology

## 2017-11-19 VITALS — BP 118/82 | HR 75 | Resp 16 | Ht 69.0 in | Wt 251.0 lb

## 2017-11-19 DIAGNOSIS — M19071 Primary osteoarthritis, right ankle and foot: Secondary | ICD-10-CM | POA: Diagnosis not present

## 2017-11-19 DIAGNOSIS — M5136 Other intervertebral disc degeneration, lumbar region: Secondary | ICD-10-CM

## 2017-11-19 DIAGNOSIS — M170 Bilateral primary osteoarthritis of knee: Secondary | ICD-10-CM

## 2017-11-19 DIAGNOSIS — M722 Plantar fascial fibromatosis: Secondary | ICD-10-CM

## 2017-11-19 DIAGNOSIS — M19072 Primary osteoarthritis, left ankle and foot: Secondary | ICD-10-CM | POA: Diagnosis not present

## 2017-11-19 DIAGNOSIS — M7062 Trochanteric bursitis, left hip: Secondary | ICD-10-CM | POA: Diagnosis not present

## 2017-11-19 DIAGNOSIS — M7061 Trochanteric bursitis, right hip: Secondary | ICD-10-CM

## 2017-11-19 DIAGNOSIS — R768 Other specified abnormal immunological findings in serum: Secondary | ICD-10-CM

## 2017-11-19 DIAGNOSIS — M7711 Lateral epicondylitis, right elbow: Secondary | ICD-10-CM

## 2017-11-19 DIAGNOSIS — M792 Neuralgia and neuritis, unspecified: Secondary | ICD-10-CM

## 2017-11-19 MED ORDER — DULOXETINE HCL 30 MG PO CPEP: 30.0000 mg | ORAL_CAPSULE | Freq: Every day | ORAL | 2 refills | Status: DC

## 2017-12-10 ENCOUNTER — Telehealth (INDEPENDENT_AMBULATORY_CARE_PROVIDER_SITE_OTHER): Payer: Self-pay

## 2017-12-10 NOTE — Telephone Encounter (Signed)
Initiated PA for Euflexxa series, bilateral knee. Per jasmine B. With [employer], [employer] form will be faxed and turn around time is 2 business days.

## 2017-12-23 ENCOUNTER — Telehealth (INDEPENDENT_AMBULATORY_CARE_PROVIDER_SITE_OTHER): Payer: Self-pay

## 2017-12-23 NOTE — Telephone Encounter (Signed)
PA form faxed for Euflexxa, bilateral knee to [phone] per Abigail Butts M.on 12/20/17.

## 2017-12-25 ENCOUNTER — Telehealth (INDEPENDENT_AMBULATORY_CARE_PROVIDER_SITE_OTHER): Payer: Self-pay

## 2017-12-25 NOTE — Telephone Encounter (Signed)
Can you please schedule patient appointment for Euflexxa series, bilateral knee. (6) Dr. Estanislado Pandy Buy & Bill Covered at 90% and patient will be responsible for 10% OOP. PA Approved ID Number: [id number] Valid 12/20/2017- 06/20/2018

## 2017-12-27 DIAGNOSIS — Z01419 Encounter for gynecological examination (general) (routine) without abnormal findings: Secondary | ICD-10-CM | POA: Diagnosis not present

## 2017-12-27 DIAGNOSIS — Z1389 Encounter for screening for other disorder: Secondary | ICD-10-CM | POA: Diagnosis not present

## 2017-12-27 DIAGNOSIS — Z130 Encounter for screening for diseases of the blood and blood-forming organs and certain disorders involving the immune mechanism: Secondary | ICD-10-CM | POA: Diagnosis not present

## 2017-12-27 DIAGNOSIS — Z6838 Body mass index (BMI) 38.0-38.9, adult: Secondary | ICD-10-CM | POA: Diagnosis not present

## 2017-12-30 ENCOUNTER — Ambulatory Visit
Admission: RE | Admit: 2017-12-30 | Discharge: 2017-12-30 | Disposition: A | Discharge status: Home / Self Care | Payer: BLUE CROSS/BLUE SHIELD | Source: Ambulatory Visit | Attending: Obstetrics and Gynecology | Admitting: Obstetrics and Gynecology

## 2017-12-30 DIAGNOSIS — N6001 Solitary cyst of right breast: Secondary | ICD-10-CM

## 2017-12-30 DIAGNOSIS — N6489 Other specified disorders of breast: Secondary | ICD-10-CM | POA: Diagnosis not present

## 2017-12-30 DIAGNOSIS — R928 Other abnormal and inconclusive findings on diagnostic imaging of breast: Secondary | ICD-10-CM | POA: Diagnosis not present

## 2018-01-06 DIAGNOSIS — H1013 Acute atopic conjunctivitis, bilateral: Secondary | ICD-10-CM | POA: Diagnosis not present

## 2018-01-10 DIAGNOSIS — H1013 Acute atopic conjunctivitis, bilateral: Secondary | ICD-10-CM | POA: Diagnosis not present

## 2018-01-10 NOTE — Telephone Encounter (Signed)
I LMOM for patient to schedule Euflexxa x3, bil knees, B/B with Lovena Le.

## 2018-01-27 ENCOUNTER — Ambulatory Visit (INDEPENDENT_AMBULATORY_CARE_PROVIDER_SITE_OTHER): Payer: BLUE CROSS/BLUE SHIELD | Admitting: Physician Assistant

## 2018-01-27 DIAGNOSIS — M1712 Unilateral primary osteoarthritis, left knee: Secondary | ICD-10-CM | POA: Diagnosis not present

## 2018-01-27 DIAGNOSIS — M1711 Unilateral primary osteoarthritis, right knee: Secondary | ICD-10-CM | POA: Diagnosis not present

## 2018-01-27 DIAGNOSIS — M170 Bilateral primary osteoarthritis of knee: Secondary | ICD-10-CM

## 2018-01-27 MED ORDER — SODIUM HYALURONATE (VISCOSUP) 20 MG/2ML IX SOSY
20.0000 mg | PREFILLED_SYRINGE | INTRA_ARTICULAR | Status: AC | PRN
  Administered 2018-01-27: 20 mg via INTRA_ARTICULAR

## 2018-01-27 MED ORDER — LIDOCAINE HCL 1 % IJ SOLN
1.5000 mL | INTRAMUSCULAR | Status: AC | PRN
  Administered 2018-01-27: 1.5 mL

## 2018-01-27 NOTE — Progress Notes (Signed)
   Procedure Note  Patient: Bethany Johns. [place]             Date of Birth: [birth year]           MRN: [id number]             Visit Date: 01/27/2018  Procedures: Visit Diagnoses: Primary osteoarthritis of both knees - Plan: Large Joint Inj: bilateral knee Euflexxa #1 bilateral knee joint injections  Large Joint Inj: bilateral knee on 01/27/2018 9:13 AM Indications: pain Details: 27 G 1.5 in needle, medial approach  Arthrogram: No  Medications (Right): 20 mg Sodium Hyaluronate 20 MG/2ML; 1.5 mL lidocaine 1 % Aspirate (Right): 0 mL Medications (Left): 20 mg Sodium Hyaluronate 20 MG/2ML; 1.5 mL lidocaine 1 % Aspirate (Left): 0 mL Outcome: tolerated well, no immediate complications Procedure, treatment alternatives, risks and benefits explained, specific risks discussed. Consent was given by the patient. Immediately prior to procedure a time out was called to verify the correct patient, procedure, equipment, support staff and site/side marked as required. Patient was prepped and draped in the usual sterile fashion.     Patient tolerated the procedure well.  Bethany Sams, PA-C

## 2018-02-03 ENCOUNTER — Ambulatory Visit (INDEPENDENT_AMBULATORY_CARE_PROVIDER_SITE_OTHER): Payer: BLUE CROSS/BLUE SHIELD | Admitting: Physician Assistant

## 2018-02-03 DIAGNOSIS — M170 Bilateral primary osteoarthritis of knee: Secondary | ICD-10-CM

## 2018-02-03 DIAGNOSIS — M1711 Unilateral primary osteoarthritis, right knee: Secondary | ICD-10-CM

## 2018-02-03 DIAGNOSIS — M1712 Unilateral primary osteoarthritis, left knee: Secondary | ICD-10-CM

## 2018-02-03 MED ORDER — LIDOCAINE HCL 1 % IJ SOLN
1.5000 mL | INTRAMUSCULAR | Status: AC | PRN
  Administered 2018-02-03: 1.5 mL

## 2018-02-03 MED ORDER — SODIUM HYALURONATE (VISCOSUP) 20 MG/2ML IX SOSY
20.0000 mg | PREFILLED_SYRINGE | INTRA_ARTICULAR | Status: AC | PRN
  Administered 2018-02-03: 20 mg via INTRA_ARTICULAR

## 2018-02-03 MED ORDER — LIDOCAINE HCL 1 % IJ SOLN
1.5000 mL | INTRAMUSCULAR | Status: AC | PRN
Start: 2018-02-03 — End: 2018-02-03
  Administered 2018-02-03: 1.5 mL

## 2018-02-03 NOTE — Progress Notes (Signed)
   Procedure Note  Patient: Charnay Nazario. [place]             Date of Birth: 14-Aug-1965           MRN: [id number]             Visit Date: 02/03/2018  Procedures: Visit Diagnoses: Primary osteoarthritis of both knees - Plan: Large Joint Inj: bilateral knee Euflexxa #2 bilateral knee joints  Large Joint Inj: bilateral knee on 02/03/2018 2:18 PM Indications: pain Details: 27 G 1.5 in needle, medial approach  Arthrogram: No  Medications (Right): 20 mg Sodium Hyaluronate 20 MG/2ML; 1.5 mL lidocaine 1 % Aspirate (Right): 0 mL Medications (Left): 20 mg Sodium Hyaluronate 20 MG/2ML; 1.5 mL lidocaine 1 % Aspirate (Left): 0 mL Outcome: tolerated well, no immediate complications Procedure, treatment alternatives, risks and benefits explained, specific risks discussed. Consent was given by the patient. Immediately prior to procedure a time out was called to verify the correct patient, procedure, equipment, support staff and site/side marked as required. Patient was prepped and draped in the usual sterile fashion.     Patient tolerated the procedure well.   Hazel Sams, PA-C

## 2018-02-10 ENCOUNTER — Ambulatory Visit (INDEPENDENT_AMBULATORY_CARE_PROVIDER_SITE_OTHER): Payer: BLUE CROSS/BLUE SHIELD | Admitting: Physician Assistant

## 2018-02-10 DIAGNOSIS — M1712 Unilateral primary osteoarthritis, left knee: Secondary | ICD-10-CM

## 2018-02-10 DIAGNOSIS — M1711 Unilateral primary osteoarthritis, right knee: Secondary | ICD-10-CM | POA: Diagnosis not present

## 2018-02-10 DIAGNOSIS — M170 Bilateral primary osteoarthritis of knee: Secondary | ICD-10-CM

## 2018-02-10 MED ORDER — SODIUM HYALURONATE (VISCOSUP) 20 MG/2ML IX SOSY
20.0000 mg | PREFILLED_SYRINGE | INTRA_ARTICULAR | Status: AC | PRN
  Administered 2018-02-10: 20 mg via INTRA_ARTICULAR

## 2018-02-10 MED ORDER — LIDOCAINE HCL 1 % IJ SOLN
1.5000 mL | INTRAMUSCULAR | Status: AC | PRN
  Administered 2018-02-10: 1.5 mL

## 2018-02-10 NOTE — Progress Notes (Signed)
   Procedure Note  Patient: Bethany Johns             Date of Birth: August 12, 1965           MRN: [id number]             Visit Date: 02/10/2018  Procedures: Visit Diagnoses: Primary osteoarthritis of both knees - Plan: Large Joint Inj: bilateral knee  Euflexxa #3-bilateral knee joint injections   Large Joint Inj: bilateral knee on 02/10/2018 12:18 PM Indications: pain Details: 27 G 1.5 in needle, medial approach  Arthrogram: No  Medications (Right): 20 mg Sodium Hyaluronate 20 MG/2ML; 1.5 mL lidocaine 1 % Aspirate (Right): 0 mL Medications (Left): 20 mg Sodium Hyaluronate 20 MG/2ML; 1.5 mL lidocaine 1 % Aspirate (Left): 0 mL Outcome: tolerated well, no immediate complications Procedure, treatment alternatives, risks and benefits explained, specific risks discussed. Consent was given by the patient. Immediately prior to procedure a time out was called to verify the correct patient, procedure, equipment, support staff and site/side marked as required. Patient was prepped and draped in the usual sterile fashion.     Patient tolerated the procedure well.   Hazel Sams, PA-C

## 2018-02-27 ENCOUNTER — Telehealth: Payer: Self-pay | Admitting: Rheumatology

## 2018-02-27 MED ORDER — DULOXETINE HCL 30 MG PO CPEP: ORAL_CAPSULE | ORAL | 0 refills | Status: DC

## 2018-02-27 NOTE — Telephone Encounter (Signed)
Okay to call in Cymbalta 30 mg p.o. daily which she can try for 2 weeks and then 30 mg every other day for 2 weeks and then discontinue

## 2018-02-27 NOTE — Telephone Encounter (Signed)
Patient states she was suppose to wien off Cymbalta, but ran out of rx of Wednesday of last week. Patient was down to one pill every other day X2 weeks. Patient still experiencing horrible nightmares, she is jittery, leg cramps, and feet hurt. Please call to discuss. Patient also questions Cymbalta increasing hot flashes.

## 2018-02-27 NOTE — Telephone Encounter (Signed)
Called patient and advised her of information below from Dr. Estanislado Pandy. A new prescription has been sent to the pharmacy.

## 2018-03-24 ENCOUNTER — Telehealth: Payer: Self-pay | Admitting: *Deleted

## 2018-03-24 NOTE — Telephone Encounter (Signed)
Prior Authorization submitted via cover my meds for Volatren Gel. It has been approved.

## 2018-03-25 ENCOUNTER — Other Ambulatory Visit: Payer: Self-pay | Admitting: Rheumatology

## 2018-03-26 NOTE — Telephone Encounter (Signed)
Last Visit: 11/19/17 Next Visit: 05/27/18  Okay to refill per Dr. Estanislado Pandy

## 2018-04-12 DIAGNOSIS — M545 Low back pain: Secondary | ICD-10-CM | POA: Diagnosis not present

## 2018-04-21 ENCOUNTER — Other Ambulatory Visit: Payer: Self-pay | Admitting: Physician Assistant

## 2018-04-21 NOTE — Telephone Encounter (Signed)
Last Visit: 11/19/17 Next Visit: 05/27/18  Okay to refill per Dr. Estanislado Pandy

## 2018-05-12 DIAGNOSIS — M545 Low back pain: Secondary | ICD-10-CM | POA: Diagnosis not present

## 2018-05-13 NOTE — Progress Notes (Deleted)
Office Visit Note  Bethany Johns             Date of Birth: July 08, 1965           MRN: [id number]             PCP: Aretta Nip, MD Referring: Aretta Nip, MD Visit Date: 05/27/2018 Occupation: @GUAROCC @  Subjective:  No chief complaint on file.   History of Present Illness: Bethany Johns is a 52 y.o. female ***   Activities of Daily Living:  Patient reports morning stiffness for *** {minute/hour:19697}.   Patient {ACTIONS;DENIES/REPORTS:21021675::"Denies"} nocturnal pain.  Difficulty dressing/grooming: {ACTIONS;DENIES/REPORTS:21021675::"Denies"} Difficulty climbing stairs: {ACTIONS;DENIES/REPORTS:21021675::"Denies"} Difficulty getting out of chair: {ACTIONS;DENIES/REPORTS:21021675::"Denies"} Difficulty using hands for taps, buttons, cutlery, and/or writing: {ACTIONS;DENIES/REPORTS:21021675::"Denies"}  No Rheumatology ROS completed.   PMFS History:  Patient Active Problem List   Diagnosis Date Noted  . Primary osteoarthritis of both feet 03/18/2017  . DDD (degenerative disc disease), lumbar 03/18/2017  . Plantar fasciitis, bilateral 02/11/2017  . Primary osteoarthritis of both knees 02/11/2017  . Gallstones 06/22/2013    Past Medical History:  Diagnosis Date  . Arthritis    both knees  . Arthritis   . Back pain    d/t sitting at a computer all day  . History of bronchitis    about 96yrs ago  . Pneumonia    as a child  . Vaginal delivery 1998    Family History  Problem Relation Age of Onset  . Breast cancer Maternal Grandmother 28  . Dementia Mother   . Cancer Father        bladder  . Healthy Daughter    Past Surgical History:  Procedure Laterality Date  . ABLATION    . ANKLE ARTHROPLASTY    . ANKLE SURGERY Right    torn ligament  . CHOLECYSTECTOMY N/A 06/26/2013   Procedure: LAPAROSCOPIC CHOLECYSTECTOMY WITH INTRAOPERATIVE CHOLANGIOGRAM, POSSIBLE OPEN;  Surgeon: [name], MD;  Location: [facility];  Service: General;   Laterality: N/A;  . CHOLECYSTECTOMY    . FOOT SURGERY     PLANTAR FASCIITIS  . FOOT SURGERY    . KNEE ARTHROPLASTY    . KNEE ARTHROSCOPY Right 08/2012  . LAPAROSCOPIC SALPINGO OOPHERECTOMY Left 06/15/2015   Procedure: LAPAROSCOPIC LEFT SALPINGO OOPHORECTOMY;  Surgeon: Paula Compton, MD;  Location: [facility];  Service: Gynecology;  Laterality: Left;  . LASIK     eye surgery  . NOVASURE ABLATION N/A 06/15/2015   Procedure: NOVASURE ABLATION;  Surgeon: Paula Compton, MD;  Location: [facility];  Service: Gynecology;  Laterality: N/A;  . OOPHORECTOMY     Social History   Social History Narrative   ** Merged History Encounter **        Objective: Vital Signs: There were no vitals taken for this visit.   Physical Exam   Musculoskeletal Exam: ***  CDAI Exam: CDAI Score: Not documented Patient Global Assessment: Not documented; Provider Global Assessment: Not documented Swollen: Not documented; Tender: Not documented Joint Exam   Not documented   There is currently no information documented on the homunculus. Go to the Rheumatology activity and complete the homunculus joint exam.  Investigation: No additional findings.  Imaging: No results found.  Recent Labs: Lab Results  Component Value Date   WBC 7.2 02/11/2017   HGB 13.5 02/11/2017   PLT 277 02/11/2017   NA 144 02/11/2017   K 4.2 02/11/2017   CL 109 02/11/2017   CO2 21 02/11/2017   GLUCOSE 102 (  H) 02/11/2017   BUN 14 02/11/2017   CREATININE 0.76 02/11/2017   BILITOT 0.3 02/11/2017   ALKPHOS 81 02/11/2017   AST 17 02/11/2017   ALT 20 02/11/2017   PROT 6.7 02/11/2017   ALBUMIN 4.1 02/11/2017   CALCIUM 9.2 02/11/2017   GFRAA >89 02/11/2017    Speciality Comments: No specialty comments available.  Procedures:  No procedures performed Allergies: Tramadol; Tramadol; Amoxicillin; and Penicillins   Assessment / Plan:     Visit Diagnoses: Primary osteoarthritis of both knees  Primary osteoarthritis of both  feet  Trochanteric bursitis of both hips  Lateral epicondylitis of right elbow  Plantar fasciitis, bilateral  Neuralgia in bilateral feet - inadequate response to Gabapentin. She is tapering off of Cymbalta  Positive ANA (antinuclear antibody) - 1:160NH   DDD (degenerative disc disease), lumbar  Vitamin D deficiency   Orders: No orders of the defined types were placed in this encounter.  No orders of the defined types were placed in this encounter.   Face-to-face time spent with patient was *** minutes. Greater than 50% of time was spent in counseling and coordination of care.  Follow-Up Instructions: No follow-ups on file.   Ofilia Neas, PA-C  Note - This record has been created using Dragon software.  Chart creation errors have been sought, but may not always  have been located. Such creation errors do not reflect on  the standard of medical care.

## 2018-05-27 ENCOUNTER — Ambulatory Visit: Payer: BLUE CROSS/BLUE SHIELD | Admitting: Physician Assistant

## 2018-06-20 NOTE — Progress Notes (Signed)
Office Visit Note  Patient: Bethany Johns. Ruggles             Date of Birth: 06-Apr-1966           MRN: [id number]             PCP: Aretta Nip, MD Referring: Aretta Nip, MD Visit Date: 07/01/2018 Occupation: @GUAROCC @  Subjective:  Pain in both knee joints   History of Present Illness: Bethany Johns. Schnoebelen is a 52 y.o. female with history of osteoarthritis and DDD.  Patient reports she continues to have pain in both feet and both knee joints intermittently.  She denies any joint swelling.  She reports that the visco gel injections for both knee joints in August 2019 improved her knee pain, especially when going up and down steps. She still tries to avoid kneeling and squatting. She strained a muscle in her back in October pulling hay.  She has been taking Meloxicam for pain relief, which has improved her back pain and overall generalized joint pain.  She will notify us when she would like to reapply for gel injections.    Activities of Daily Living:  Patient reports morning stiffness for 0 minutes.   Patient Reports nocturnal pain.  Difficulty dressing/grooming: Denies Difficulty climbing stairs: Reports Difficulty getting out of chair: Denies Difficulty using hands for taps, buttons, cutlery, and/or writing: Denies  Review of Systems  Constitutional: Positive for fatigue.  HENT: Positive for mouth sores. Negative for trouble swallowing, trouble swallowing and mouth dryness.   Eyes: Negative for pain, redness, itching and dryness.  Respiratory: Negative for shortness of breath, wheezing and difficulty breathing.   Cardiovascular: Negative for chest pain, palpitations and swelling in legs/feet.  Gastrointestinal: Negative for abdominal pain, blood in stool, constipation and diarrhea.  Endocrine: Negative for increased urination.  Genitourinary: Negative for painful urination, nocturia and pelvic pain.  Musculoskeletal: Positive for arthralgias, joint pain and joint swelling.  Negative for morning stiffness.  Skin: Negative for rash and hair loss.  Allergic/Immunologic: Negative for susceptible to infections.  Neurological: Negative for dizziness, light-headedness, headaches, memory loss and weakness.  Hematological: Negative for bruising/bleeding tendency.  Psychiatric/Behavioral: Negative for confusion. The patient is not nervous/anxious.     PMFS History:  Patient Active Problem List   Diagnosis Date Noted  . Primary osteoarthritis of both feet 03/18/2017  . DDD (degenerative disc disease), lumbar 03/18/2017  . Plantar fasciitis, bilateral 02/11/2017  . Primary osteoarthritis of both knees 02/11/2017  . Gallstones 06/22/2013    Past Medical History:  Diagnosis Date  . Arthritis    both knees  . Arthritis   . Back pain    d/t sitting at a computer all day  . History of bronchitis    about 50yrs ago  . Pneumonia    as a child  . Vaginal delivery 1998    Family History  Problem Relation Age of Onset  . Breast cancer Maternal Grandmother 70  . Dementia Mother   . Cancer Father        bladder  . Healthy Daughter    Past Surgical History:  Procedure Laterality Date  . ABLATION    . ANKLE ARTHROPLASTY    . ANKLE SURGERY Right    torn ligament  . CHOLECYSTECTOMY N/A 06/26/2013   Procedure: LAPAROSCOPIC CHOLECYSTECTOMY WITH INTRAOPERATIVE CHOLANGIOGRAM, POSSIBLE OPEN;  Surgeon: [name], MD;  Location: [facility];  Service: General;  Laterality: N/A;  . CHOLECYSTECTOMY    . FOOT SURGERY  PLANTAR FASCIITIS  . FOOT SURGERY    . KNEE ARTHROPLASTY    . KNEE ARTHROSCOPY Right 08/2012  . LAPAROSCOPIC SALPINGO OOPHERECTOMY Left 06/15/2015   Procedure: LAPAROSCOPIC LEFT SALPINGO OOPHORECTOMY;  Surgeon: Paula Compton, MD;  Location: [facility];  Service: Gynecology;  Laterality: Left;  . LASIK     eye surgery  . NOVASURE ABLATION N/A 06/15/2015   Procedure: NOVASURE ABLATION;  Surgeon: Paula Compton, MD;  Location: [facility];  Service:  Gynecology;  Laterality: N/A;  . OOPHORECTOMY     Social History   Social History Narrative   ** Merged History Encounter **        Objective: Vital Signs: BP (!) 145/89 (BP Location: Left Arm, Patient Position: Sitting, Cuff Size: Large)   Pulse 74   Resp 15   Ht 5\' 9"  (1.753 m)   Wt 257 lb 12.8 oz (116.9 kg)   BMI 38.07 kg/m    Physical Exam Vitals signs and nursing note reviewed.  Constitutional:      Appearance: She is well-developed.  HENT:     Head: Normocephalic and atraumatic.  Eyes:     Conjunctiva/sclera: Conjunctivae normal.  Neck:     Musculoskeletal: Normal range of motion.  Cardiovascular:     Rate and Rhythm: Normal rate and regular rhythm.     Heart sounds: Normal heart sounds.  Pulmonary:     Effort: Pulmonary effort is normal.     Breath sounds: Normal breath sounds.  Abdominal:     General: Bowel sounds are normal.     Palpations: Abdomen is soft.  Lymphadenopathy:     Cervical: No cervical adenopathy.  Skin:    General: Skin is warm and dry.     Capillary Refill: Capillary refill takes less than 2 seconds.  Neurological:     Mental Status: She is alert and oriented to person, place, and time.  Psychiatric:        Behavior: Behavior normal.      Musculoskeletal Exam: C-spine, thoracic spine, and lumbar spine good ROM.  No midline spinal tenderness. Muscular strain in lower back. No SI joint tenderness.  Shoulder joints, elbow joints, wrist joints, MCPs, PIPs, and DIPs good ROM with no synovitis.  Complete fist formation bilaterally.  Hip joints, knee joints, ankle joints, MTPs, PIPs, and DIPs good ROM with no synovitis.  No warmth or effusion of knee joints.  No tenderness or swelling of ankle joints. No tenderness over trochanteric bursa bilaterally.   CDAI Exam: CDAI Score: Not documented Patient Global Assessment: Not documented; Provider Global Assessment: Not documented Swollen: Not documented; Tender: Not documented Joint Exam   Not  documented   There is currently no information documented on the homunculus. Go to the Rheumatology activity and complete the homunculus joint exam.  Investigation: No additional findings.  Imaging: No results found.  Recent Labs: Lab Results  Component Value Date   WBC 7.2 02/11/2017   HGB 13.5 02/11/2017   PLT 277 02/11/2017   NA 144 02/11/2017   K 4.2 02/11/2017   CL 109 02/11/2017   CO2 21 02/11/2017   GLUCOSE 102 (H) 02/11/2017   BUN 14 02/11/2017   CREATININE 0.76 02/11/2017   BILITOT 0.3 02/11/2017   ALKPHOS 81 02/11/2017   AST 17 02/11/2017   ALT 20 02/11/2017   PROT 6.7 02/11/2017   ALBUMIN 4.1 02/11/2017   CALCIUM 9.2 02/11/2017   GFRAA >89 02/11/2017    Speciality Comments: No specialty comments available.  Procedures:  No  procedures performed Allergies: Tramadol; Tramadol; Amoxicillin; and Penicillins   Assessment / Plan:     Visit Diagnoses: Primary osteoarthritis of both knees: She has no warmth or effusion of knee joints on exam.  She has good ROM.  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  She was encouraged to continue to try to lose weight.  She has been taking Meloxicam for pain relief following a muscle strain in her lower back since October, and she feels the Meloxicam has been helping her knee pain.  She has noticed improvement in bilateral knee pain since completing a series of visco gel injections in August 2019.  She does not find voltaren gel to be effective so she has stopped using it. She continues to take natural anti-inflammatories. She will notify us when she is ready to reapply for visco gel injections. She will follow up in 6 months.  Primary osteoarthritis of both feet: She has intermittent discomfort in both feet.  She wears proper fitting shoes.  She has been taking meloxicam for pain relief.   Trochanteric bursitis of both hips: She has no tenderness on exam today.  Lateral epicondylitis of right elbow: Resolved.   Plantar  fasciitis, bilateral: Resolved.  She wears proper fitting shoes.   Neuralgia in bilateral feet  Positive ANA (antinuclear antibody) - 1:160 NH: She has no feature of autoimmune disease at this time.   DDD (degenerative disc disease), lumbar: She strained a muscle in her lower back in October 2019, and she has been taking Meloxicam for pain relief, which has improved the discomfort.  She continues to have some muscle tenderness in the paraspinal muscles of the lumbar spine.   Vitamin D deficiency: She takes a vitamin D supplement daily.   Orders: No orders of the defined types were placed in this encounter.  No orders of the defined types were placed in this encounter.     Follow-Up Instructions: Return in about 6 months (around 12/30/2018) for Osteoarthritis.   Ofilia Neas, PA-C  Note - This record has been created using Dragon software.  Chart creation errors have been sought, but may not always  have been located. Such creation errors do not reflect on  the standard of medical care.

## 2018-07-01 ENCOUNTER — Ambulatory Visit: Payer: BLUE CROSS/BLUE SHIELD | Admitting: Physician Assistant

## 2018-07-01 ENCOUNTER — Encounter: Payer: Self-pay | Admitting: Physician Assistant

## 2018-07-01 VITALS — BP 145/89 | HR 74 | Resp 15 | Ht 69.0 in | Wt 257.8 lb

## 2018-07-01 DIAGNOSIS — M170 Bilateral primary osteoarthritis of knee: Secondary | ICD-10-CM | POA: Diagnosis not present

## 2018-07-01 DIAGNOSIS — M19072 Primary osteoarthritis, left ankle and foot: Secondary | ICD-10-CM

## 2018-07-01 DIAGNOSIS — M19071 Primary osteoarthritis, right ankle and foot: Secondary | ICD-10-CM | POA: Diagnosis not present

## 2018-07-01 DIAGNOSIS — M7061 Trochanteric bursitis, right hip: Secondary | ICD-10-CM

## 2018-07-01 DIAGNOSIS — M7711 Lateral epicondylitis, right elbow: Secondary | ICD-10-CM | POA: Diagnosis not present

## 2018-07-01 DIAGNOSIS — E559 Vitamin D deficiency, unspecified: Secondary | ICD-10-CM

## 2018-07-01 DIAGNOSIS — M7062 Trochanteric bursitis, left hip: Secondary | ICD-10-CM

## 2018-07-01 DIAGNOSIS — M5136 Other intervertebral disc degeneration, lumbar region: Secondary | ICD-10-CM

## 2018-07-01 DIAGNOSIS — M792 Neuralgia and neuritis, unspecified: Secondary | ICD-10-CM

## 2018-07-01 DIAGNOSIS — R768 Other specified abnormal immunological findings in serum: Secondary | ICD-10-CM

## 2018-07-01 DIAGNOSIS — M722 Plantar fascial fibromatosis: Secondary | ICD-10-CM

## 2018-07-01 NOTE — Patient Instructions (Signed)

## 2018-07-20 ENCOUNTER — Other Ambulatory Visit: Payer: Self-pay | Admitting: Rheumatology

## 2018-07-21 NOTE — Telephone Encounter (Signed)
Last Visit:07/01/18 Next Visit: 12/30/18  Okay to refill per Dr. Estanislado Pandy

## 2018-08-07 DIAGNOSIS — M545 Low back pain: Secondary | ICD-10-CM | POA: Diagnosis not present

## 2018-08-07 DIAGNOSIS — M5417 Radiculopathy, lumbosacral region: Secondary | ICD-10-CM | POA: Diagnosis not present

## 2018-08-15 DIAGNOSIS — M545 Low back pain: Secondary | ICD-10-CM | POA: Diagnosis not present

## 2018-08-22 DIAGNOSIS — M5126 Other intervertebral disc displacement, lumbar region: Secondary | ICD-10-CM | POA: Diagnosis not present

## 2018-08-22 DIAGNOSIS — D3500 Benign neoplasm of unspecified adrenal gland: Secondary | ICD-10-CM | POA: Insufficient documentation

## 2018-08-22 DIAGNOSIS — M5136 Other intervertebral disc degeneration, lumbar region: Secondary | ICD-10-CM | POA: Diagnosis not present

## 2018-09-02 DIAGNOSIS — M5136 Other intervertebral disc degeneration, lumbar region: Secondary | ICD-10-CM | POA: Diagnosis not present

## 2018-09-09 DIAGNOSIS — M545 Low back pain: Secondary | ICD-10-CM | POA: Diagnosis not present

## 2018-09-24 ENCOUNTER — Encounter: Payer: Self-pay | Admitting: Adult Health

## 2018-09-24 ENCOUNTER — Other Ambulatory Visit: Payer: Self-pay

## 2018-09-24 ENCOUNTER — Ambulatory Visit: Payer: BLUE CROSS/BLUE SHIELD | Admitting: Adult Health

## 2018-09-24 VITALS — BP 124/88 | HR 83 | Temp 98.4°F | Ht 67.25 in | Wt 250.2 lb

## 2018-09-24 DIAGNOSIS — Z0000 Encounter for general adult medical examination without abnormal findings: Secondary | ICD-10-CM

## 2018-09-24 DIAGNOSIS — Z23 Encounter for immunization: Secondary | ICD-10-CM | POA: Diagnosis not present

## 2018-09-24 DIAGNOSIS — Z8632 Personal history of gestational diabetes: Secondary | ICD-10-CM | POA: Diagnosis not present

## 2018-09-24 DIAGNOSIS — M545 Low back pain: Secondary | ICD-10-CM | POA: Diagnosis not present

## 2018-09-24 NOTE — Assessment & Plan Note (Signed)
Will check A1c with CPE labs

## 2018-09-24 NOTE — Assessment & Plan Note (Signed)
Increase water intake, strive for at least 68 ounces/day.   Follow Mediterranean diet Continue your active lifestyle. Try to reduce to stop tobacco use- you can do it! Continue with your Orthopedic Specialist and Rheumatologist as directed. Please schedule complete physical in 2 months, fasting labs the week prior.

## 2018-09-24 NOTE — Patient Instructions (Signed)
Mediterranean Diet A Mediterranean diet refers to food and lifestyle choices that are based on the traditions of countries located on the [employer]. This way of eating has been shown to help prevent certain conditions and improve outcomes for people who have chronic diseases, like kidney disease and heart disease. What are tips for following this plan? Lifestyle  Cook and eat meals together with your family, when possible.  Drink enough fluid to keep your urine clear or pale yellow.  Be physically active every day. This includes: ? Aerobic exercise like running or swimming. ? Leisure activities like gardening, walking, or housework.  Get 7-8 hours of sleep each night.  If recommended by your health care provider, drink red wine in moderation. This means 1 glass a day for nonpregnant women and 2 glasses a day for men. A glass of wine equals 5 oz (150 mL). Reading food labels   Check the serving size of packaged foods. For foods such as rice and pasta, the serving size refers to the amount of cooked product, not dry.  Check the total fat in packaged foods. Avoid foods that have saturated fat or trans fats.  Check the ingredients list for added sugars, such as corn syrup. Shopping  At the grocery store, buy most of your food from the areas near the walls of the store. This includes: ? Fresh fruits and vegetables (produce). ? Grains, beans, nuts, and seeds. Some of these may be available in unpackaged forms or large amounts (in bulk). ? Fresh seafood. ? Poultry and eggs. ? Low-fat dairy products.  Buy whole ingredients instead of prepackaged foods.  Buy fresh fruits and vegetables in-season from local farmers markets.  Buy frozen fruits and vegetables in resealable bags.  If you do not have access to quality fresh seafood, buy precooked frozen shrimp or canned fish, such as tuna, salmon, or sardines.  Buy small amounts of raw or cooked vegetables, salads, or olives from  the deli or salad bar at your store.  Stock your pantry so you always have certain foods on hand, such as olive oil, canned tuna, canned tomatoes, rice, pasta, and beans. Cooking  Cook foods with extra-virgin olive oil instead of using butter or other vegetable oils.  Have meat as a side dish, and have vegetables or grains as your main dish. This means having meat in small portions or adding small amounts of meat to foods like pasta or stew.  Use beans or vegetables instead of meat in common dishes like chili or lasagna.  Experiment with different cooking methods. Try roasting or broiling vegetables instead of steaming or sauteing them.  Add frozen vegetables to soups, stews, pasta, or rice.  Add nuts or seeds for added healthy fat at each meal. You can add these to yogurt, salads, or vegetable dishes.  Marinate fish or vegetables using olive oil, lemon juice, garlic, and fresh herbs. Meal planning   Plan to eat 1 vegetarian meal one day each week. Try to work up to 2 vegetarian meals, if possible.  Eat seafood 2 or more times a week.  Have healthy snacks readily available, such as: ? Vegetable sticks with hummus. ? [place] yogurt. ? Fruit and nut trail mix.  Eat balanced meals throughout the week. This includes: ? Fruit: 2-3 servings a day ? Vegetables: 4-5 servings a day ? Low-fat dairy: 2 servings a day ? Fish, poultry, or lean meat: 1 serving a day ? Beans and legumes: 2 or more servings a week ?  Nuts and seeds: 1-2 servings a day ? Whole grains: 6-8 servings a day ? Extra-virgin olive oil: 3-4 servings a day  Limit red meat and sweets to only a few servings a month What are my food choices?  Mediterranean diet ? Recommended ? Grains: Whole-grain pasta. Brown rice. Bulgar wheat. Polenta. Couscous. Whole-wheat bread. [place]. ? Vegetables: Artichokes. Beets. Broccoli. Cabbage. Carrots. Eggplant. Green beans. Chard. Kale. Spinach. Onions. Leeks. Peas. Squash.  Tomatoes. Peppers. Radishes. ? Fruits: Apples. Apricots. Avocado. Berries. Bananas. Cherries. Dates. Figs. Grapes. Lemons. Melon. Oranges. Peaches. Plums. Pomegranate. ? Meats and other protein foods: Beans. Almonds. Sunflower seeds. Pine nuts. Peanuts. [place]. Salmon. [place]all[place]. Shrimp. [place]. Tilapia. Clams. Oysters. Eggs. ? Dairy: Low-fat milk. Cheese. Greek yogurt. ? Beverages: Water. Red wine. Herbal tea. ? Fats and oils: Extra virgin olive oil. Avocado oil. Grape seed oil. ? Sweets and desserts: [place] yogurt with honey. Baked apples. Poached pears. Trail mix. ? Seasoning and other foods: Basil. Cilantro. Coriander. Cumin. Mint. Parsley. Sage. Rosemary. Tarragon. Garlic. Oregano. Thyme. Pepper. Balsalmic vinegar. Tahini. Hummus. Tomato sauce. Olives. Mushrooms. ? Limit these ? Grains: Prepackaged pasta or rice dishes. Prepackaged cereal with added sugar. ? Vegetables: Deep fried potatoes (french fries). ? Fruits: Fruit canned in syrup. ? Meats and other protein foods: Beef. Pork. Lamb. Poultry with skin. Hot dogs. Berniece Salines. ? Dairy: Ice cream. Sour cream. Whole milk. ? Beverages: Juice. Sugar-sweetened soft drinks. Beer. Liquor and spirits. ? Fats and oils: Butter. Canola oil. Vegetable oil. Beef fat (tallow). Lard. ? Sweets and desserts: Cookies. Cakes. Pies. Candy. ? Seasoning and other foods: Mayonnaise. Premade sauces and marinades. ? The items listed may not be a complete list. Talk with your dietitian about what dietary choices are right for you. Summary  The Mediterranean diet includes both food and lifestyle choices.  Eat a variety of fresh fruits and vegetables, beans, nuts, seeds, and whole grains.  Limit the amount of red meat and sweets that you eat.  Talk with your health care provider about whether it is safe for you to drink red wine in moderation. This means 1 glass a day for nonpregnant women and 2 glasses a day for men. A glass of wine equals 5 oz (150 mL). This information  is not intended to replace advice given to you by your health care provider. Make sure you discuss any questions you have with your health care provider. Document Released: 02/02/2016 Document Revised: 03/06/2016 Document Reviewed: 02/02/2016 Elsevier Interactive Patient Education  2019 [employer].  Increase water intake, strive for at least 68 ounces/day.   Follow Mediterranean diet Continue your active lifestyle. Try to reduce to stop tobacco use- you can do it! Continue with your Orthopedic Specialist and Rheumatologist as directed. Please schedule complete physical in 2 months, fasting labs the week prior. WELCOME TO THE PRACTICE!

## 2018-09-24 NOTE — Progress Notes (Signed)
Subjective:    Patient ID: Bethany Johns. Allmon, female    DOB: Sep 11, 1965, 53 y.o.   MRN: [id number]  HPI:  Ms. Bethany Johns is here to establish as a new pt.  She is a pleasant 53 year old female. PMH: DDD, Chronic pain- lumbar back, bil knees, bil feet, Obesity, and Tobacco use She smokes 1/2 pack day for the last 5 years, declined tobacco cessation She is followed by [facility] for various musculoskeletal issues She is followed by Rheumatologist for poss RA Her Bethany Johns was dx'd with dementia in mid 74s, it is quite advanced now, Bethany Johns is currently 67- lives at home with her father (age 29 in pretty good health). 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 Healthcare maintenance 09/24/2018  . Personal history of gestational diabetes 09/24/2018  . Primary osteoarthritis of both feet 03/18/2017  . DDD (degenerative disc disease), lumbar 03/18/2017  . Plantar fasciitis, bilateral 02/11/2017  . Primary osteoarthritis of both knees 02/11/2017  . Gallstones 06/22/2013     Past Medical History:  Diagnosis Date  . Arthritis    both knees  . Arthritis   . Back pain    d/t sitting at a computer all day  . Diabetes mellitus without  complication (HCC)    gestational   . History of bronchitis    about 61yrs ago  . Pneumonia    as a child  . Vaginal delivery 1998     Past Surgical History:  Procedure Laterality Date  . ABLATION    . ANKLE ARTHROPLASTY    . ANKLE SURGERY Right    torn ligament  . CHOLECYSTECTOMY N/A 06/26/2013   Procedure: LAPAROSCOPIC CHOLECYSTECTOMY WITH INTRAOPERATIVE CHOLANGIOGRAM, POSSIBLE OPEN;  Surgeon: [name], MD;  Location: [place];  Service: General;  Laterality: N/A;  . CHOLECYSTECTOMY    . FOOT SURGERY     PLANTAR FASCIITIS  . FOOT SURGERY    . KNEE ARTHROPLASTY    . KNEE ARTHROSCOPY Right 08/2012  . LAPAROSCOPIC SALPINGO OOPHERECTOMY Left 06/15/2015   Procedure: LAPAROSCOPIC LEFT SALPINGO OOPHORECTOMY;  Surgeon: Paula Compton, MD;  Location: [facility];  Service: Gynecology;  Laterality: Left;  . LASIK     eye surgery  . NOVASURE ABLATION N/A 06/15/2015   Procedure: NOVASURE ABLATION;  Surgeon: Paula Compton, MD;  Location: [facility];  Service: Gynecology;  Laterality: N/A;  . OOPHORECTOMY       Family History  Problem Relation Age of Onset  . Breast cancer Maternal Grandmother 81  . Dementia Bethany Johns   . Cancer Father        bladder  . Hypertension Father   . Healthy Daughter   .  Aneurysm Paternal Grandfather      Social History   Substance and Sexual Activity  Drug Use Never     Social History   Substance and Sexual Activity  Alcohol Use No   Comment: occasional beer     Social History   Tobacco Use  Smoking Status Current Every Day Smoker  . Packs/day: 0.50  . Years: 5.00  . Pack years: 2.50  . Types: Cigarettes  Smokeless Tobacco Never Used  Tobacco Comment   SOCIALLY ONLY     Outpatient Encounter Medications as of 09/24/2018  Medication Sig  . omeprazole (PRILOSEC) 40 MG capsule daily.  . [DISCONTINUED] Ascorbic Acid (VITAMIN C PO) Take by mouth daily.  . [DISCONTINUED] aspirin-acetaminophen-caffeine (EXCEDRIN MIGRAINE) 250-250-65 MG  tablet Take 2 tablets by mouth every 6 (six) hours as needed (for cramps).  . [DISCONTINUED] BLACK COHOSH PO Take by mouth daily.  . [DISCONTINUED] calcium carbonate (TUMS - DOSED IN MG ELEMENTAL CALCIUM) 500 MG chewable tablet Chew 3 tablets by mouth daily as needed for indigestion or heartburn.  . [DISCONTINUED] Cholecalciferol (VITAMIN D PO) Take by mouth daily.  . [DISCONTINUED] DULoxetine (CYMBALTA) 30 MG capsule Take 1 tablet by mouth daily for two weeks, then take 1 tablet by mouth every other day for two weeks. (Patient not taking: Reported on 07/01/2018)  . [DISCONTINUED] DULoxetine (CYMBALTA) 30 MG capsule TAKE 1 CAPSULE BY MOUTH EVERY DAY  . [DISCONTINUED] Ginger, Zingiber officinalis, (GINGER PO) Take by mouth.  . [DISCONTINUED] ibuprofen (ADVIL,MOTRIN) 600 MG tablet Take 1 tablet (600 mg total) by mouth every 6 (six) hours as needed. (Patient not taking: Reported on 11/19/2017)  . [DISCONTINUED] IBUPROFEN PO Take by mouth as needed.   . [DISCONTINUED] MELOXICAM PO Take by mouth daily.  . [DISCONTINUED] Misc Natural Products (TART CHERRY ADVANCED PO) Take by mouth.  . [DISCONTINUED] Omega-3 Fatty Acids (FISH OIL) 1000 MG CAPS Take by mouth.  . [DISCONTINUED] Omega-3 Fatty Acids (FISH OIL) 1200 MG CAPS Take 1 capsule by mouth daily.  . [DISCONTINUED] TURMERIC PO Take by mouth.   No facility-administered encounter medications on file as of 09/24/2018.     Allergies: Tramadol; Tramadol; Amoxicillin; and Penicillins  Body mass index is 38.9 kg/m.  Blood pressure 124/88, pulse 83, temperature 98.4 F (36.9 C), temperature source Oral, height 5' 7.25" (1.708 m), weight 250 lb 3.2 oz (113.5 kg), SpO2 97 %.  Review of Systems  Constitutional: Positive for fatigue. Negative for activity change, appetite change, chills, diaphoresis, fever and unexpected weight change.  HENT: Negative for congestion.   Eyes: Negative for visual disturbance.  Respiratory: Negative for cough, chest  tightness, shortness of breath, wheezing and stridor.   Cardiovascular: Negative for chest pain, palpitations and leg swelling.  Endocrine: Negative for cold intolerance, heat intolerance, polydipsia, polyphagia and polyuria.  Genitourinary: Negative for difficulty urinating and flank pain.  Musculoskeletal: Positive for arthralgias, back pain, gait problem, joint swelling, myalgias, neck pain and neck stiffness.  Skin: Negative for color change, pallor, rash and wound.  Neurological: Negative for dizziness and light-headedness.  Hematological: Does not bruise/bleed easily.  Psychiatric/Behavioral: Negative for agitation, behavioral problems, confusion, decreased concentration, dysphoric mood, hallucinations, self-injury, sleep disturbance and suicidal ideas. The patient is not nervous/anxious and is not hyperactive.        Objective:   Physical Exam Vitals signs and nursing note reviewed.  Constitutional:      General: She is not in acute distress.    Appearance: She is obese. She is not  ill-appearing, toxic-appearing or diaphoretic.  HENT:     Head: Normocephalic and atraumatic.  Eyes:     Extraocular Movements: Extraocular movements intact.     Conjunctiva/sclera: Conjunctivae normal.     Pupils: Pupils are equal, round, and reactive to light.  Cardiovascular:     Rate and Rhythm: Normal rate.     Pulses: Normal pulses.     Heart sounds: Normal heart sounds. No murmur. No friction rub. No gallop.   Pulmonary:     Effort: Pulmonary effort is normal. No respiratory distress.     Breath sounds: Normal breath sounds. No stridor. No wheezing, rhonchi or rales.  Chest:     Chest wall: No tenderness.  Skin:    General: Skin is warm and dry.     Capillary Refill: Capillary refill takes less than 2 seconds.  Neurological:     Mental Status: She is alert and oriented to person, place, and time.  Psychiatric:        Mood and Affect: Mood normal.        Behavior: Behavior normal.         Thought Content: Thought content normal.        Judgment: Judgment normal.       Assessment & Plan:   1. Personal history of gestational diabetes   2. Healthcare maintenance     Healthcare maintenance Increase water intake, strive for at least 68 ounces/day.   Follow Mediterranean diet Continue your active lifestyle. Try to reduce to stop tobacco use- you can do it! Continue with your Orthopedic Specialist and Rheumatologist as directed. Please schedule complete physical in 2 months, fasting labs the week prior.  Personal history of gestational diabetes Will check A1c with CPE labs    FOLLOW-UP:  Return in about 2 months (around 11/24/2018) for Fasting Labs, CPE.

## 2018-09-25 ENCOUNTER — Encounter: Payer: Self-pay | Admitting: Adult Health

## 2018-09-25 DIAGNOSIS — R350 Frequency of micturition: Secondary | ICD-10-CM | POA: Diagnosis not present

## 2018-09-25 DIAGNOSIS — R351 Nocturia: Secondary | ICD-10-CM | POA: Diagnosis not present

## 2018-10-06 DIAGNOSIS — M545 Low back pain: Secondary | ICD-10-CM | POA: Diagnosis not present

## 2018-10-21 DIAGNOSIS — M545 Low back pain: Secondary | ICD-10-CM | POA: Diagnosis not present

## 2018-12-01 ENCOUNTER — Other Ambulatory Visit: Payer: Self-pay

## 2018-12-01 ENCOUNTER — Other Ambulatory Visit: Payer: BC Managed Care – PPO

## 2018-12-01 DIAGNOSIS — Z0000 Encounter for general adult medical examination without abnormal findings: Secondary | ICD-10-CM

## 2018-12-01 DIAGNOSIS — Z8632 Personal history of gestational diabetes: Secondary | ICD-10-CM | POA: Diagnosis not present

## 2018-12-02 LAB — CBC WITH DIFFERENTIAL/PLATELET
Basophils Absolute: 0.1 10*3/uL (ref 0.0–0.2)
Basos: 1 %
EOS (ABSOLUTE): 0.2 10*3/uL (ref 0.0–0.4)
Eos: 3 %
Hematocrit: 40.8 % (ref 34.0–46.6)
Hemoglobin: 13.3 g/dL (ref 11.1–15.9)
Immature Grans (Abs): 0 10*3/uL (ref 0.0–0.1)
Immature Granulocytes: 0 %
Lymphocytes Absolute: 2.6 10*3/uL (ref 0.7–3.1)
Lymphs: 35 %
MCH: 31.6 pg (ref 26.6–33.0)
MCHC: 32.6 g/dL (ref 31.5–35.7)
MCV: 97 fL (ref 79–97)
Monocytes Absolute: 0.5 10*3/uL (ref 0.1–0.9)
Monocytes: 7 %
Neutrophils Absolute: 3.9 10*3/uL (ref 1.4–7.0)
Neutrophils: 54 %
Platelets: 328 10*3/uL (ref 150–450)
RBC: 4.21 x10E6/uL (ref 3.77–5.28)
RDW: 12.5 % (ref 11.7–15.4)
WBC: 7.3 10*3/uL (ref 3.4–10.8)

## 2018-12-02 LAB — COMPREHENSIVE METABOLIC PANEL
ALT: 24 IU/L (ref 0–32)
AST: 18 IU/L (ref 0–40)
Albumin/Globulin Ratio: 1.5 (ref 1.2–2.2)
Albumin: 4 g/dL (ref 3.8–4.9)
Alkaline Phosphatase: 82 IU/L (ref 39–117)
BUN/Creatinine Ratio: 20 (ref 9–23)
BUN: 15 mg/dL (ref 6–24)
Bilirubin Total: 0.3 mg/dL (ref 0.0–1.2)
CO2: 25 mmol/L (ref 20–29)
Calcium: 9.2 mg/dL (ref 8.7–10.2)
Chloride: 108 mmol/L — ABNORMAL HIGH (ref 96–106)
Creatinine, Ser: 0.75 mg/dL (ref 0.57–1.00)
GFR calc Af Amer: 106 mL/min/{1.73_m2} (ref >59)
GFR calc non Af Amer: 92 mL/min/{1.73_m2} (ref >59)
Globulin, Total: 2.6 g/dL (ref 1.5–4.5)
Glucose: 107 mg/dL — ABNORMAL HIGH (ref 65–99)
Potassium: 4.8 mmol/L (ref 3.5–5.2)
Sodium: 141 mmol/L (ref 134–144)
Total Protein: 6.6 g/dL (ref 6.0–8.5)

## 2018-12-02 LAB — LIPID PANEL
Chol/HDL Ratio: 3.2 ratio (ref 0.0–4.4)
Cholesterol, Total: 165 mg/dL (ref 100–199)
HDL: 52 mg/dL (ref >39)
LDL Calculated: 90 mg/dL (ref 0–99)
Triglycerides: 113 mg/dL (ref 0–149)
VLDL Cholesterol Cal: 23 mg/dL (ref 5–40)

## 2018-12-02 LAB — HEMOGLOBIN A1C
Est. average glucose Bld gHb Est-mCnc: 117 mg/dL
Hgb A1c MFr Bld: 5.7 % — ABNORMAL HIGH (ref 4.8–5.6)

## 2018-12-02 LAB — TSH: TSH: 2.97 u[IU]/mL (ref 0.450–4.500)

## 2018-12-02 LAB — HEPATITIS C ANTIBODY: Hep C Virus Ab: <0.1 s/co ratio (ref 0.0–0.9)

## 2018-12-02 NOTE — Progress Notes (Signed)
Subjective:    Patient ID: Bethany Johns. Tukes, female    DOB: [birth year], 53 y.o.   MRN: [id number]  HPI: 09/24/2018 OV:  Ms. Bethany Johns is here to establish as a new pt.  She is a pleasant 53 year old female. PMH: DDD, Chronic pain- lumbar back, bil knees, bil feet, Obesity, and Tobacco use She smokes 1/2 pack day for the last 5 years, declined tobacco cessation She is followed by [facility] for various musculoskeletal issues She is followed by Rheumatologist for poss RA Her mother was dx'd with dementia in mid 11s, it is quite advanced now, mother is currently 30- lives at home with her father (age 54 in pretty good health). 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 is here for CPE She reports only using Omeprazole 40mg  PRN for GERD sx's She denies plain water intake, prefers to hydrate with Diet Green Tea. She is quite active with house/yard/farm work. 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 She plans on starting "Health Dare" program soon to achieve weight loss. Discussed referral to [facility]- she declined at this time She reports difficulty breathing out of her L nare, r/t facial injuries She reports snoring at night, agreeable to ENT referral  12/01/2018 Labs TSH-WNL, 2.970 Hep C-Negative A1c-5.7, pre-diabetic CBC-stable CMP-stable The 10-year ASCVD risk score Mikey Bussing DC Brooke Bonito., et al., 2013) is: 3.2%   Values used to calculate the score:     Age: 38 years     Sex: Female     Is Non-Hispanic African American: No     Diabetic: No     Tobacco smoker: Yes     Systolic Blood Pressure: 992 mmHg     Is BP treated: No     HDL Cholesterol: 52 mg/dL      Total Cholesterol: 165 mg/dL LDL-90  Healthcare Maintenance: PAP-UTD, last normal Mammogram-UTD, last normal Colonoscopy-UTD, last 10/25/2017, polyps removed, repeat in 5 years Immunizations-UTD   Patient Care Team    Relationship Specialty Notifications Start End  Mina Marble D, NP PCP - General Family Medicine  09/24/18   Rankins, Bill Salinas, MD  Family Medicine  01/10/17    Comment: Meriel Pica, [place]  Specialist  09/24/18   Bo Merino, MD Consulting Physician Rheumatology  09/24/18   Wallene Huh, [place] Consulting Physician Podiatry  09/24/18   Paula Compton, MD Consulting Physician Obstetrics and Gynecology  09/24/18   Gastroenterology, [place]  Gastroenterology  09/25/18   Imaging, [facility] Radiology  09/25/18     Patient Active Problem List   Diagnosis Date Noted  . Deviated nasal septum 12/04/2018  . BMI 38.0-38.9,adult 12/04/2018  . Healthcare maintenance 09/24/2018  . Personal history of gestational diabetes 09/24/2018  . Primary osteoarthritis of both feet 03/18/2017  . DDD (degenerative disc disease), lumbar 03/18/2017  . Plantar fasciitis, bilateral 02/11/2017  . Primary osteoarthritis of both knees 02/11/2017  . Gallstones 06/22/2013     Past Medical History:  Diagnosis Date  . Arthritis    both knees  . Arthritis   . Back pain    d/t sitting at  a computer all day  . Diabetes mellitus without complication (HCC)    gestational   . History of bronchitis    about 75yrs ago  . Pneumonia    as a child  . Vaginal delivery 1998     Past Surgical History:  Procedure Laterality Date  . ABLATION    . ANKLE ARTHROPLASTY    . ANKLE SURGERY Right    torn ligament  . CHOLECYSTECTOMY N/A 06/26/2013   Procedure: LAPAROSCOPIC CHOLECYSTECTOMY WITH INTRAOPERATIVE CHOLANGIOGRAM, POSSIBLE OPEN;  Surgeon: [name], MD;  Location: [facility];  Service: General;  Laterality: N/A;  . CHOLECYSTECTOMY    . FOOT SURGERY      PLANTAR FASCIITIS  . FOOT SURGERY    . KNEE ARTHROPLASTY    . KNEE ARTHROSCOPY Right 08/2012  . LAPAROSCOPIC SALPINGO OOPHERECTOMY Left 06/15/2015   Procedure: LAPAROSCOPIC LEFT SALPINGO OOPHORECTOMY;  Surgeon: Paula Compton, MD;  Location: [facility];  Service: Gynecology;  Laterality: Left;  . LASIK     eye surgery  . NOVASURE ABLATION N/A 06/15/2015   Procedure: NOVASURE ABLATION;  Surgeon: Paula Compton, MD;  Location: [facility];  Service: Gynecology;  Laterality: N/A;  . OOPHORECTOMY       Family History  Problem Relation Age of Onset  . Breast cancer Maternal Grandmother 24  . Dementia Mother   . Cancer Father        bladder  . Hypertension Father   . Healthy Daughter   . Aneurysm Paternal Grandfather      Social History   Substance and Sexual Activity  Drug Use Never     Social History   Substance and Sexual Activity  Alcohol Use No   Comment: occasional beer     Social History   Tobacco Use  Smoking Status Current Every Day Smoker  . Packs/day: 0.50  . Years: 5.00  . Pack years: 2.50  . Types: Cigarettes  Smokeless Tobacco Never Used  Tobacco Comment   SOCIALLY ONLY     Outpatient Encounter Medications as of 12/04/2018  Medication Sig  . omeprazole (PRILOSEC) 40 MG capsule daily.   No facility-administered encounter medications on file as of 12/04/2018.     Allergies: Tramadol, Tramadol, Amoxicillin, and Penicillins  Body mass index is 38.83 kg/m.  Blood pressure 107/72, pulse 85, temperature 98.8 F (37.1 C), temperature source Oral, height 5\' 8"  (1.727 m), weight 255 lb 6.4 oz (115.8 kg), SpO2 97 %.  Review of Systems  Constitutional: Positive for fatigue. Negative for activity change, appetite change, chills, diaphoresis, fever and unexpected weight change.  HENT: Negative for congestion and postnasal drip.        Difficulty breathing out of L nare  Eyes: Negative for visual disturbance.  Respiratory: Negative for cough, chest  tightness, shortness of breath, wheezing and stridor.   Cardiovascular: Negative for chest pain, palpitations and leg swelling.  Gastrointestinal: Negative for abdominal distention, abdominal pain, blood in stool, constipation, diarrhea, nausea and vomiting.  Endocrine: Negative for cold intolerance, heat intolerance, polydipsia, polyphagia and polyuria.  Genitourinary: Negative for difficulty urinating and flank pain.  Musculoskeletal: Positive for arthralgias, myalgias and neck stiffness. Negative for back pain, gait problem, joint swelling and neck pain.  Skin: Negative for color change, pallor, rash and wound.  Neurological: Negative for dizziness and headaches.  Hematological: Negative for adenopathy. Does not bruise/bleed easily.  Psychiatric/Behavioral: Negative for agitation, behavioral problems, confusion, decreased concentration, dysphoric mood, hallucinations, self-injury, sleep disturbance and suicidal ideas. The patient is not  nervous/anxious and is not hyperactive.        Objective:   Physical Exam Vitals signs and nursing note reviewed.  Constitutional:      General: She is not in acute distress.    Appearance: She is obese. She is not ill-appearing, toxic-appearing or diaphoretic.  HENT:     Head: Normocephalic and atraumatic.     Right Ear: Tympanic membrane, ear canal and external ear normal. There is no impacted cerumen.     Left Ear: Tympanic membrane, ear canal and external ear normal. There is no impacted cerumen.     Nose: Septal deviation present. No congestion or rhinorrhea.     Right Sinus: No frontal sinus tenderness.     Left Sinus: No frontal sinus tenderness.     Mouth/Throat:     Mouth: Mucous membranes are moist.  Eyes:     Extraocular Movements: Extraocular movements intact.     Conjunctiva/sclera: Conjunctivae normal.     Pupils: Pupils are equal, round, and reactive to light.  Neck:     Musculoskeletal: Normal range of motion.  Cardiovascular:      Rate and Rhythm: Normal rate.     Pulses: Normal pulses.     Heart sounds: No murmur. No gallop.   Pulmonary:     Effort: Pulmonary effort is normal. No respiratory distress.     Breath sounds: Normal breath sounds. No stridor. No wheezing, rhonchi or rales.  Chest:     Chest wall: No tenderness.  Abdominal:     General: Abdomen is protuberant. Bowel sounds are normal.     Tenderness: There is no abdominal tenderness. There is no right CVA tenderness, left CVA tenderness or guarding.  Genitourinary:    Comments: Will updated with OB/GYN in July 2020 Lymphadenopathy:     Cervical: No cervical adenopathy.  Skin:    General: Skin is warm and dry.     Capillary Refill: Capillary refill takes less than 2 seconds.  Neurological:     Mental Status: She is alert and oriented to person, place, and time.  Psychiatric:        Mood and Affect: Mood normal.        Behavior: Behavior normal.        Thought Content: Thought content normal.        Judgment: Judgment normal.        Assessment & Plan:   1. Nasal septal deviation   2. Pre-diabetes   3. Healthcare maintenance   4. Deviated nasal septum   5. BMI 38.0-38.9,adult     Healthcare maintenance Overall your labs look good, with only minor elevation in A1c (average of your blood sugar over the last 66months). Please schedule lab appt in 6 months to have your A1c re-checked. Increase water intake, strive for at least 64 ounces/day.   Follow Heart Healthy diet Remain as active as possible. If your would like referral to [facility], please call us. Referral to ENT placed to address deviated septum. Please schedule complete physical in one year, fasting labs the week prior. Continue to social distance and wear a mask when in public.  Deviated nasal septum L nare due to trauma long ago Referral to ENT placed    FOLLOW-UP:  Return in about 1 year (around 12/04/2019) for CPE, Fasting Labs.

## 2018-12-04 ENCOUNTER — Ambulatory Visit (INDEPENDENT_AMBULATORY_CARE_PROVIDER_SITE_OTHER): Payer: BC Managed Care – PPO | Admitting: Adult Health

## 2018-12-04 ENCOUNTER — Other Ambulatory Visit: Payer: Self-pay

## 2018-12-04 ENCOUNTER — Encounter: Payer: Self-pay | Admitting: Adult Health

## 2018-12-04 VITALS — BP 107/72 | HR 85 | Temp 98.8°F | Ht 68.0 in | Wt 255.4 lb

## 2018-12-04 DIAGNOSIS — Z0000 Encounter for general adult medical examination without abnormal findings: Secondary | ICD-10-CM | POA: Diagnosis not present

## 2018-12-04 DIAGNOSIS — J342 Deviated nasal septum: Secondary | ICD-10-CM

## 2018-12-04 DIAGNOSIS — R7303 Prediabetes: Secondary | ICD-10-CM | POA: Diagnosis not present

## 2018-12-04 DIAGNOSIS — Z6838 Body mass index (BMI) 38.0-38.9, adult: Secondary | ICD-10-CM | POA: Diagnosis not present

## 2018-12-04 NOTE — Assessment & Plan Note (Signed)
L nare due to trauma long ago Referral to ENT placed

## 2018-12-04 NOTE — Assessment & Plan Note (Signed)
Overall your labs look good, with only minor elevation in A1c (average of your blood sugar over the last 8months). Please schedule lab appt in 6 months to have your A1c re-checked. Increase water intake, strive for at least 64 ounces/day.   Follow Heart Healthy diet Remain as active as possible. If your would like referral to [facility], please call us. Referral to ENT placed to address deviated septum. Please schedule complete physical in one year, fasting labs the week prior. Continue to social distance and wear a mask when in public.

## 2018-12-04 NOTE — Patient Instructions (Addendum)
Preventive Care for Adults, Female  A healthy lifestyle and preventive care can promote health and wellness. Preventive health guidelines for women include the following key practices.   A routine yearly physical is a good way to check with your health care provider about your health and preventive screening. It is a chance to share any concerns and updates on your health and to receive a thorough exam.   Visit your dentist for a routine exam and preventive care every 6 months. Brush your teeth twice a day and floss once a day. Good oral hygiene prevents tooth decay and gum disease.   The frequency of eye exams is based on your age, health, family medical history, use of contact lenses, and other factors. Follow your health care provider's recommendations for frequency of eye exams.   Eat a healthy diet. Foods like vegetables, fruits, whole grains, low-fat dairy products, and lean protein foods contain the nutrients you need without too many calories. Decrease your intake of foods high in solid fats, added sugars, and salt. Eat the right amount of calories for you.Get information about a proper diet from your health care provider, if necessary.   Regular physical exercise is one of the most important things you can do for your health. Most adults should get at least 150 minutes of moderate-intensity exercise (any activity that increases your heart rate and causes you to sweat) each week. In addition, most adults need muscle-strengthening exercises on 2 or more days a week.   Maintain a healthy weight. The body mass index (BMI) is a screening tool to identify possible weight problems. It provides an estimate of body fat based on height and weight. Your health care provider can find your BMI, and can help you achieve or maintain a healthy weight.For adults 20 years and older:   - A BMI below 18.5 is considered underweight.   - A BMI of 18.5 to 24.9 is normal.   - A BMI of 25 to 29.9 is  considered overweight.   - A BMI of 30 and above is considered obese.   Maintain normal blood lipids and cholesterol levels by exercising and minimizing your intake of trans and saturated fats.  Eat a balanced diet with plenty of fruit and vegetables. Blood tests for lipids and cholesterol should begin at age 20 and be repeated every 5 years minimum.  If your lipid or cholesterol levels are high, you are over 40, or you are at high risk for heart disease, you may need your cholesterol levels checked more frequently.Ongoing high lipid and cholesterol levels should be treated with medicines if diet and exercise are not working.   If you smoke, find out from your health care provider how to quit. If you do not use tobacco, do not start.   Lung cancer screening is recommended for adults aged 55-80 years who are at high risk for developing lung cancer because of a history of smoking. A yearly low-dose CT scan of the lungs is recommended for people who have at least a 30-pack-year history of smoking and are a current smoker or have quit within the past 15 years. A pack year of smoking is smoking an average of 1 pack of cigarettes a day for 1 year (for example: 1 pack a day for 30 years or 2 packs a day for 15 years). Yearly screening should continue until the smoker has stopped smoking for at least 15 years. Yearly screening should be stopped for people who develop a   health problem that would prevent them from having lung cancer treatment.   If you are pregnant, do not drink alcohol. If you are breastfeeding, be very cautious about drinking alcohol. If you are not pregnant and choose to drink alcohol, do not have more than 1 drink per day. One drink is considered to be 12 ounces (355 mL) of beer, 5 ounces (148 mL) of wine, or 1.5 ounces (44 mL) of liquor.   Avoid use of street drugs. Do not share needles with anyone. Ask for help if you need support or instructions about stopping the use of  drugs.   High blood pressure causes heart disease and increases the risk of stroke. Your blood pressure should be checked at least yearly.  Ongoing high blood pressure should be treated with medicines if weight loss and exercise do not work.   If you are 69-55 years old, ask your health care provider if you should take aspirin to prevent strokes.   Diabetes screening involves taking a blood sample to check your fasting blood sugar level. This should be done once every 3 years, after age 38, if you are within normal weight and without risk factors for diabetes. Testing should be considered at a younger age or be carried out more frequently if you are overweight and have at least 1 risk factor for diabetes.   Breast cancer screening is essential preventive care for women. You should practice "breast self-awareness."  This means understanding the normal appearance and feel of your breasts and may include breast self-examination.  Any changes detected, no matter how small, should be reported to a health care provider.  Women in their 80s and 30s should have a clinical breast exam (CBE) by a health care provider as part of a regular health exam every 1 to 3 years.  After age 66, women should have a CBE every year.  Starting at age 1, women should consider having a mammogram (breast X-ray test) every year.  Women who have a family history of breast cancer should talk to their health care provider about genetic screening.  Women at a high risk of breast cancer should talk to their health care providers about having an MRI and a mammogram every year.   -Breast cancer gene (BRCA)-related cancer risk assessment is recommended for women who have family members with BRCA-related cancers. BRCA-related cancers include breast, ovarian, tubal, and peritoneal cancers. Having family members with these cancers may be associated with an increased risk for harmful changes (mutations) in the breast cancer genes BRCA1 and  BRCA2. Results of the assessment will determine the need for genetic counseling and BRCA1 and BRCA2 testing.   The Pap test is a screening test for cervical cancer. A Pap test can show cell changes on the cervix that might become cervical cancer if left untreated. A Pap test is a procedure in which cells are obtained and examined from the lower end of the uterus (cervix).   - Women should have a Pap test starting at age 57.   - Between ages 90 and 70, Pap tests should be repeated every 2 years.   - Beginning at age 63, you should have a Pap test every 3 years as long as the past 3 Pap tests have been normal.   - Some women have medical problems that increase the chance of getting cervical cancer. Talk to your health care provider about these problems. It is especially important to talk to your health care provider if a  new problem develops soon after your last Pap test. In these cases, your health care provider may recommend more frequent screening and Pap tests.   - The above recommendations are the same for women who have or have not gotten the vaccine for human papillomavirus (HPV).   - If you had a hysterectomy for a problem that was not cancer or a condition that could lead to cancer, then you no longer need Pap tests. Even if you no longer need a Pap test, a regular exam is a good idea to make sure no other problems are starting.   - If you are between ages 36 and 66 years, and you have had normal Pap tests going back 10 years, you no longer need Pap tests. Even if you no longer need a Pap test, a regular exam is a good idea to make sure no other problems are starting.   - If you have had past treatment for cervical cancer or a condition that could lead to cancer, you need Pap tests and screening for cancer for at least 20 years after your treatment.   - If Pap tests have been discontinued, risk factors (such as a new sexual partner) need to be reassessed to determine if screening should  be resumed.   - The HPV test is an additional test that may be used for cervical cancer screening. The HPV test looks for the virus that can cause the cell changes on the cervix. The cells collected during the Pap test can be tested for HPV. The HPV test could be used to screen women aged 70 years and older, and should be used in women of any age who have unclear Pap test results. After the age of 67, women should have HPV testing at the same frequency as a Pap test.   Colorectal cancer can be detected and often prevented. Most routine colorectal cancer screening begins at the age of 57 years and continues through age 26 years. However, your health care provider may recommend screening at an earlier age if you have risk factors for colon cancer. On a yearly basis, your health care provider may provide home test kits to check for hidden blood in the stool.  Use of a small camera at the end of a tube, to directly examine the colon (sigmoidoscopy or colonoscopy), can detect the earliest forms of colorectal cancer. Talk to your health care provider about this at age 23, when routine screening begins. Direct exam of the colon should be repeated every 5 -10 years through age 49 years, unless early forms of pre-cancerous polyps or small growths are found.   People who are at an increased risk for hepatitis B should be screened for this virus. You are considered at high risk for hepatitis B if:  -You were born in a country where hepatitis B occurs often. Talk with your health care provider about which countries are considered high risk.  - Your parents were born in a high-risk country and you have not received a shot to protect against hepatitis B (hepatitis B vaccine).  - You have HIV or AIDS.  - You use needles to inject street drugs.  - You live with, or have sex with, someone who has Hepatitis B.  - You get hemodialysis treatment.  - You take certain medicines for conditions like cancer, organ  transplantation, and autoimmune conditions.   Hepatitis C blood testing is recommended for all people born from [birth year] through 1965 and any individual  with known risks for hepatitis C.   Practice safe sex. Use condoms and avoid high-risk sexual practices to reduce the spread of sexually transmitted infections (STIs). STIs include gonorrhea, chlamydia, syphilis, trichomonas, herpes, HPV, and human immunodeficiency virus (HIV). Herpes, HIV, and HPV are viral illnesses that have no cure. They can result in disability, cancer, and death. Sexually active women aged 25 years and younger should be checked for chlamydia. Older women with new or multiple partners should also be tested for chlamydia. Testing for other STIs is recommended if you are sexually active and at increased risk.   Osteoporosis is a disease in which the bones lose minerals and strength with aging. This can result in serious bone fractures or breaks. The risk of osteoporosis can be identified using a bone density scan. Women ages 65 years and over and women at risk for fractures or osteoporosis should discuss screening with their health care providers. Ask your health care provider whether you should take a calcium supplement or vitamin D to There are also several preventive steps women can take to avoid osteoporosis and resulting fractures or to keep osteoporosis from worsening. -->Recommendations include:  Eat a balanced diet high in fruits, vegetables, calcium, and vitamins.  Get enough calcium. The recommended total intake of is 1,200 mg daily; for best absorption, if taking supplements, divide doses into 250-500 mg doses throughout the day. Of the two types of calcium, calcium carbonate is best absorbed when taken with food but calcium citrate can be taken on an empty stomach.  Get enough vitamin D. NAMS and the National Osteoporosis Foundation recommend at least 1,000 IU per day for women age 50 and over who are at risk of vitamin D  deficiency. Vitamin D deficiency can be caused by inadequate sun exposure (for example, those who live in northern latitudes).  Avoid alcohol and smoking. Heavy alcohol intake (more than 7 drinks per week) increases the risk of falls and hip fracture and women smokers tend to lose bone more rapidly and have lower bone mass than nonsmokers. Stopping smoking is one of the most important changes women can make to improve their health and decrease risk for disease.  Be physically active every day. Weight-bearing exercise (for example, fast walking, hiking, jogging, and weight training) may strengthen bones or slow the rate of bone loss that comes with aging. Balancing and muscle-strengthening exercises can reduce the risk of falling and fracture.  Consider therapeutic medications. Currently, several types of effective drugs are available. Healthcare providers can recommend the type most appropriate for each woman.  Eliminate environmental factors that may contribute to accidents. Falls cause nearly 90% of all osteoporotic fractures, so reducing this risk is an important bone-health strategy. Measures include ample lighting, removing obstructions to walking, using nonskid rugs on floors, and placing mats and/or grab bars in showers.  Be aware of medication side effects. Some common medicines make bones weaker. These include a type of steroid drug called glucocorticoids used for arthritis and asthma, some antiseizure drugs, certain sleeping pills, treatments for endometriosis, and some cancer drugs. An overactive thyroid gland or using too much thyroid hormone for an underactive thyroid can also be a problem. If you are taking these medicines, talk to your doctor about what you can do to help protect your bones.reduce the rate of osteoporosis.    Menopause can be associated with physical symptoms and risks. Hormone replacement therapy is available to decrease symptoms and risks. You should talk to your  health care provider   about whether hormone replacement therapy is right for you.   Use sunscreen. Apply sunscreen liberally and repeatedly throughout the day. You should seek shade when your shadow is shorter than you. Protect yourself by wearing long sleeves, pants, a wide-brimmed hat, and sunglasses year round, whenever you are outdoors.   Once a month, do a whole body skin exam, using a mirror to look at the skin on your back. Tell your health care provider of new moles, moles that have irregular borders, moles that are larger than a pencil eraser, or moles that have changed in shape or color.   -Stay current with required vaccines (immunizations).   Influenza vaccine. All adults should be immunized every year.  Tetanus, diphtheria, and acellular pertussis (Td, Tdap) vaccine. Pregnant women should receive 1 dose of Tdap vaccine during each pregnancy. The dose should be obtained regardless of the length of time since the last dose. Immunization is preferred during the 27th 36th week of gestation. An adult who has not previously received Tdap or who does not know her vaccine status should receive 1 dose of Tdap. This initial dose should be followed by tetanus and diphtheria toxoids (Td) booster doses every 10 years. Adults with an unknown or incomplete history of completing a 3-dose immunization series with Td-containing vaccines should begin or complete a primary immunization series including a Tdap dose. Adults should receive a Td booster every 10 years.  Varicella vaccine. An adult without evidence of immunity to varicella should receive 2 doses or a second dose if she has previously received 1 dose. Pregnant females who do not have evidence of immunity should receive the first dose after pregnancy. This first dose should be obtained before leaving the health care facility. The second dose should be obtained 4 8 weeks after the first dose.  Human papillomavirus (HPV) vaccine. Females aged 13 26  years who have not received the vaccine previously should obtain the 3-dose series. The vaccine is not recommended for use in pregnant females. However, pregnancy testing is not needed before receiving a dose. If a female is found to be pregnant after receiving a dose, no treatment is needed. In that case, the remaining doses should be delayed until after the pregnancy. Immunization is recommended for any person with an immunocompromised condition through the age of 26 years if she did not get any or all doses earlier. During the 3-dose series, the second dose should be obtained 4 8 weeks after the first dose. The third dose should be obtained 24 weeks after the first dose and 16 weeks after the second dose.  Zoster vaccine. One dose is recommended for adults aged 60 years or older unless certain conditions are present.  Measles, mumps, and rubella (MMR) vaccine. Adults born before 1957 generally are considered immune to measles and mumps. Adults born in 1957 or later should have 1 or more doses of MMR vaccine unless there is a contraindication to the vaccine or there is laboratory evidence of immunity to each of the three diseases. A routine second dose of MMR vaccine should be obtained at least 28 days after the first dose for students attending postsecondary schools, health care workers, or international travelers. People who received inactivated measles vaccine or an unknown type of measles vaccine during 1963 1967 should receive 2 doses of MMR vaccine. People who received inactivated mumps vaccine or an unknown type of mumps vaccine before 1979 and are at high risk for mumps infection should consider immunization with 2 doses of   MMR vaccine. For females of childbearing age, rubella immunity should be determined. If there is no evidence of immunity, females who are not pregnant should be vaccinated. If there is no evidence of immunity, females who are pregnant should delay immunization until after pregnancy.  Unvaccinated health care workers born before [birth year] who lack laboratory evidence of measles, mumps, or rubella immunity or laboratory confirmation of disease should consider measles and mumps immunization with 2 doses of MMR vaccine or rubella immunization with 1 dose of MMR vaccine.  Pneumococcal 13-valent conjugate (PCV13) vaccine. When indicated, a person who is uncertain of her immunization history and has no record of immunization should receive the PCV13 vaccine. An adult aged 54 years or older who has certain medical conditions and has not been previously immunized should receive 1 dose of PCV13 vaccine. This PCV13 should be followed with a dose of pneumococcal polysaccharide (PPSV23) vaccine. The PPSV23 vaccine dose should be obtained at least 8 weeks after the dose of PCV13 vaccine. An adult aged 58 years or older who has certain medical conditions and previously received 1 or more doses of PPSV23 vaccine should receive 1 dose of PCV13. The PCV13 vaccine dose should be obtained 1 or more years after the last PPSV23 vaccine dose.  Pneumococcal polysaccharide (PPSV23) vaccine. When PCV13 is also indicated, PCV13 should be obtained first. All adults aged 58 years and older should be immunized. An adult younger than age 65 years who has certain medical conditions should be immunized. Any person who resides in a nursing home or long-term care facility should be immunized. An adult smoker should be immunized. People with an immunocompromised condition and certain other conditions should receive both PCV13 and PPSV23 vaccines. People with human immunodeficiency virus (HIV) infection should be immunized as soon as possible after diagnosis. Immunization during chemotherapy or radiation therapy should be avoided. Routine use of PPSV23 vaccine is not recommended for American Indians, [place] Natives, or people younger than 65 years unless there are medical conditions that require PPSV23 vaccine. When indicated,  people who have unknown immunization and have no record of immunization should receive PPSV23 vaccine. One-time revaccination 5 years after the first dose of PPSV23 is recommended for people aged 70 64 years who have chronic kidney failure, nephrotic syndrome, asplenia, or immunocompromised conditions. People who received 1 2 doses of PPSV23 before age 32 years should receive another dose of PPSV23 vaccine at age 96 years or later if at least 5 years have passed since the previous dose. Doses of PPSV23 are not needed for people immunized with PPSV23 at or after age 55 years.  Meningococcal vaccine. Adults with asplenia or persistent complement component deficiencies should receive 2 doses of quadrivalent meningococcal conjugate (MenACWY-D) vaccine. The doses should be obtained at least 2 months apart. Microbiologists working with certain meningococcal bacteria, [employer] recruits, people at risk during an outbreak, and people who travel to or live in countries with a high rate of meningitis should be immunized. A first-year college student up through age 58 years who is living in a residence hall should receive a dose if she did not receive a dose on or after her 16th birthday. Adults who have certain high-risk conditions should receive one or more doses of vaccine.  Hepatitis A vaccine. Adults who wish to be protected from this disease, have certain high-risk conditions, work with hepatitis A-infected animals, work in hepatitis A research labs, or travel to or work in countries with a high rate of hepatitis A should be  immunized. Adults who were previously unvaccinated and who anticipate close contact with an international adoptee during the first 60 days after arrival in the [place] from a country with a high rate of hepatitis A should be immunized.  Hepatitis B vaccine.  Adults who wish to be protected from this disease, have certain high-risk conditions, may be exposed to blood or other infectious  body fluids, are household contacts or sex partners of hepatitis B positive people, are clients or workers in certain care facilities, or travel to or work in countries with a high rate of hepatitis B should be immunized.  Haemophilus influenzae type b (Hib) vaccine. A previously unvaccinated person with asplenia or sickle cell disease or having a scheduled splenectomy should receive 1 dose of Hib vaccine. Regardless of previous immunization, a recipient of a hematopoietic stem cell transplant should receive a 3-dose series 6 12 months after her successful transplant. Hib vaccine is not recommended for adults with HIV infection.  Preventive Services / Frequency Ages 6 to 39years  Blood pressure check.** / Every 1 to 2 years.  Lipid and cholesterol check.** / Every 5 years beginning at age 39.  Clinical breast exam.** / Every 3 years for women in their 61s and 62s.  BRCA-related cancer risk assessment.** / For women who have family members with a BRCA-related cancer (breast, ovarian, tubal, or peritoneal cancers).  Pap test.** / Every 2 years from ages 47 through 85. Every 3 years starting at age 34 through age 12 or 74 with a history of 3 consecutive normal Pap tests.  HPV screening.** / Every 3 years from ages 46 through ages 43 to 54 with a history of 3 consecutive normal Pap tests.  Hepatitis C blood test.** / For any individual with known risks for hepatitis C.  Skin self-exam. / Monthly.  Influenza vaccine. / Every year.  Tetanus, diphtheria, and acellular pertussis (Tdap, Td) vaccine.** / Consult your health care provider. Pregnant women should receive 1 dose of Tdap vaccine during each pregnancy. 1 dose of Td every 10 years.  Varicella vaccine.** / Consult your health care provider. Pregnant females who do not have evidence of immunity should receive the first dose after pregnancy.  HPV vaccine. / 3 doses over 6 months, if 64 and younger. The vaccine is not recommended for use in  pregnant females. However, pregnancy testing is not needed before receiving a dose.  Measles, mumps, rubella (MMR) vaccine.** / You need at least 1 dose of MMR if you were born in 1957 or later. You may also need a 2nd dose. For females of childbearing age, rubella immunity should be determined. If there is no evidence of immunity, females who are not pregnant should be vaccinated. If there is no evidence of immunity, females who are pregnant should delay immunization until after pregnancy.  Pneumococcal 13-valent conjugate (PCV13) vaccine.** / Consult your health care provider.  Pneumococcal polysaccharide (PPSV23) vaccine.** / 1 to 2 doses if you smoke cigarettes or if you have certain conditions.  Meningococcal vaccine.** / 1 dose if you are age 71 to 37 years and a Market researcher living in a residence hall, or have one of several medical conditions, you need to get vaccinated against meningococcal disease. You may also need additional booster doses.  Hepatitis A vaccine.** / Consult your health care provider.  Hepatitis B vaccine.** / Consult your health care provider.  Haemophilus influenzae type b (Hib) vaccine.** / Consult your health care provider.  Ages 55 to 64years  Blood pressure check.** / Every 1 to 2 years.  Lipid and cholesterol check.** / Every 5 years beginning at age 20 years.  Lung cancer screening. / Every year if you are aged 55 80 years and have a 30-pack-year history of smoking and currently smoke or have quit within the past 15 years. Yearly screening is stopped once you have quit smoking for at least 15 years or develop a health problem that would prevent you from having lung cancer treatment.  Clinical breast exam.** / Every year after age 40 years.  BRCA-related cancer risk assessment.** / For women who have family members with a BRCA-related cancer (breast, ovarian, tubal, or peritoneal cancers).  Mammogram.** / Every year beginning at age 40  years and continuing for as long as you are in good health. Consult with your health care provider.  Pap test.** / Every 3 years starting at age 30 years through age 65 or 70 years with a history of 3 consecutive normal Pap tests.  HPV screening.** / Every 3 years from ages 30 years through ages 65 to 70 years with a history of 3 consecutive normal Pap tests.  Fecal occult blood test (FOBT) of stool. / Every year beginning at age 50 years and continuing until age 75 years. You may not need to do this test if you get a colonoscopy every 10 years.  Flexible sigmoidoscopy or colonoscopy.** / Every 5 years for a flexible sigmoidoscopy or every 10 years for a colonoscopy beginning at age 50 years and continuing until age 75 years.  Hepatitis C blood test.** / For all people born from 1945 through 1965 and any individual with known risks for hepatitis C.  Skin self-exam. / Monthly.  Influenza vaccine. / Every year.  Tetanus, diphtheria, and acellular pertussis (Tdap/Td) vaccine.** / Consult your health care provider. Pregnant women should receive 1 dose of Tdap vaccine during each pregnancy. 1 dose of Td every 10 years.  Varicella vaccine.** / Consult your health care provider. Pregnant females who do not have evidence of immunity should receive the first dose after pregnancy.  Zoster vaccine.** / 1 dose for adults aged 60 years or older.  Measles, mumps, rubella (MMR) vaccine.** / You need at least 1 dose of MMR if you were born in 1957 or later. You may also need a 2nd dose. For females of childbearing age, rubella immunity should be determined. If there is no evidence of immunity, females who are not pregnant should be vaccinated. If there is no evidence of immunity, females who are pregnant should delay immunization until after pregnancy.  Pneumococcal 13-valent conjugate (PCV13) vaccine.** / Consult your health care provider.  Pneumococcal polysaccharide (PPSV23) vaccine.** / 1 to 2 doses if  you smoke cigarettes or if you have certain conditions.  Meningococcal vaccine.** / Consult your health care provider.  Hepatitis A vaccine.** / Consult your health care provider.  Hepatitis B vaccine.** / Consult your health care provider.  Haemophilus influenzae type b (Hib) vaccine.** / Consult your health care provider.  Ages 65 years and over  Blood pressure check.** / Every 1 to 2 years.  Lipid and cholesterol check.** / Every 5 years beginning at age 20 years.  Lung cancer screening. / Every year if you are aged 55 80 years and have a 30-pack-year history of smoking and currently smoke or have quit within the past 15 years. Yearly screening is stopped once you have quit smoking for at least 15 years or develop a health problem that   would prevent you from having lung cancer treatment.  Clinical breast exam.** / Every year after age 103 years.  BRCA-related cancer risk assessment.** / For women who have family members with a BRCA-related cancer (breast, ovarian, tubal, or peritoneal cancers).  Mammogram.** / Every year beginning at age 36 years and continuing for as long as you are in good health. Consult with your health care provider.  Pap test.** / Every 3 years starting at age 5 years through age 85 or 10 years with 3 consecutive normal Pap tests. Testing can be stopped between 65 and 70 years with 3 consecutive normal Pap tests and no abnormal Pap or HPV tests in the past 10 years.  HPV screening.** / Every 3 years from ages 93 years through ages 70 or 45 years with a history of 3 consecutive normal Pap tests. Testing can be stopped between 65 and 70 years with 3 consecutive normal Pap tests and no abnormal Pap or HPV tests in the past 10 years.  Fecal occult blood test (FOBT) of stool. / Every year beginning at age 8 years and continuing until age 45 years. You may not need to do this test if you get a colonoscopy every 10 years.  Flexible sigmoidoscopy or colonoscopy.** /  Every 5 years for a flexible sigmoidoscopy or every 10 years for a colonoscopy beginning at age 69 years and continuing until age 68 years.  Hepatitis C blood test.** / For all people born from [birth year] through 1964 and any individual with known risks for hepatitis C.  Osteoporosis screening.** / A one-time screening for women ages 7 years and over and women at risk for fractures or osteoporosis.  Skin self-exam. / Monthly.  Influenza vaccine. / Every year.  Tetanus, diphtheria, and acellular pertussis (Tdap/Td) vaccine.** / 1 dose of Td every 10 years.  Varicella vaccine.** / Consult your health care provider.  Zoster vaccine.** / 1 dose for adults aged 5 years or older.  Pneumococcal 13-valent conjugate (PCV13) vaccine.** / Consult your health care provider.  Pneumococcal polysaccharide (PPSV23) vaccine.** / 1 dose for all adults aged 74 years and older.  Meningococcal vaccine.** / Consult your health care provider.  Hepatitis A vaccine.** / Consult your health care provider.  Hepatitis B vaccine.** / Consult your health care provider.  Haemophilus influenzae type b (Hib) vaccine.** / Consult your health care provider. ** Family history and personal history of risk and conditions may change your health care provider's recommendations. Document Released: 08/07/2001 Document Revised: 04/01/2013  [facility] Patient Information 2014 [place], [place].   EXERCISE AND DIET:  We recommended that you start or continue a regular exercise program for good health. Regular exercise means any activity that makes your heart beat faster and makes you sweat.  We recommend exercising at least 30 minutes per day at least 3 days a week, preferably 5.  We also recommend a diet low in fat and sugar / carbohydrates.  Inactivity, poor dietary choices and obesity can cause diabetes, heart attack, stroke, and kidney damage, among others.     ALCOHOL AND SMOKING:  Women should limit their alcohol intake to no  more than 7 drinks/beers/glasses of wine (combined, not each!) per week. Moderation of alcohol intake to this level decreases your risk of breast cancer and liver damage.  ( And of course, no recreational drugs are part of a healthy lifestyle.)  Also, you should not be smoking at all or even being exposed to second hand smoke. Most people know smoking can  cause cancer, and various heart and lung diseases, but did you know it also contributes to weakening of your bones?  Aging of your skin?  Yellowing of your teeth and nails?   CALCIUM AND VITAMIN D:  Adequate intake of calcium and Vitamin D are recommended.  The recommendations for exact amounts of these supplements seem to change often, but generally speaking 600 mg of calcium (either carbonate or citrate) and 800 units of Vitamin D per day seems prudent. Certain women may benefit from higher intake of Vitamin D.  If you are among these women, your doctor will have told you during your visit.     PAP SMEARS:  Pap smears, to check for cervical cancer or precancers,  have traditionally been done yearly, although recent scientific advances have shown that most women can have pap smears less often.  However, every woman still should have a physical exam from her gynecologist or primary care physician every year. It will include a breast check, inspection of the vulva and vagina to check for abnormal growths or skin changes, a visual exam of the cervix, and then an exam to evaluate the size and shape of the uterus and ovaries.  And after 52 years of age, a rectal exam is indicated to check for rectal cancers. We will also provide age appropriate advice regarding health maintenance, like when you should have certain vaccines, screening for sexually transmitted diseases, bone density testing, colonoscopy, mammograms, etc.    MAMMOGRAMS:  All women over 80 years old should have a yearly mammogram. Many facilities now offer a "3D" mammogram, which may cost  around $50 extra out of pocket. If possible,  we recommend you accept the option to have the 3D mammogram performed.  It both reduces the number of women who will be called back for extra views which then turn out to be normal, and it is better than the routine mammogram at detecting truly abnormal areas.     COLONOSCOPY:  Colonoscopy to screen for colon cancer is recommended for all women at age 22.  We know, you hate the idea of the prep.  We agree, BUT, having colon cancer and not knowing it is worse!!  Colon cancer so often starts as a polyp that can be seen and removed at colonscopy, which can quite literally save your life!  And if your first colonoscopy is normal and you have no family history of colon cancer, most women don't have to have it again for 10 years.  Once every ten years, you can do something that may end up saving your life, right?  We will be happy to help you get it scheduled when you are ready.  Be sure to check your insurance coverage so you understand how much it will cost.  It may be covered as a preventative service at no cost, but you should check your particular policy.    Overall your labs look good, with only minor elevation in A1c (average of your blood sugar over the last 56month). Please schedule lab appt in 6 months to have your A1c re-checked. Increase water intake, strive for at least 64 ounces/day.   Follow Heart Healthy diet Remain as active as possible. If your would like referral to [facility], please call [place] Referral to ENT placed to address deviated septum. Please schedule complete physical in one year, fasting labs the week prior. Continue to social distance and wear a mask when in public. NICE TO SEE YOU!

## 2018-12-09 DIAGNOSIS — R0683 Snoring: Secondary | ICD-10-CM | POA: Insufficient documentation

## 2018-12-09 DIAGNOSIS — K1379 Other lesions of oral mucosa: Secondary | ICD-10-CM | POA: Diagnosis not present

## 2018-12-09 DIAGNOSIS — J343 Hypertrophy of nasal turbinates: Secondary | ICD-10-CM | POA: Diagnosis not present

## 2018-12-09 DIAGNOSIS — J351 Hypertrophy of tonsils: Secondary | ICD-10-CM | POA: Diagnosis not present

## 2018-12-09 DIAGNOSIS — J342 Deviated nasal septum: Secondary | ICD-10-CM | POA: Diagnosis not present

## 2018-12-17 NOTE — Progress Notes (Deleted)
Office Visit Note  Patient: Bethany Johns. Mead             Date of Birth: 1965/07/28           MRN: [id number]             PCP: Esaw Grandchild, NP Referring: Aretta Nip, MD Visit Date: 12/30/2018 Occupation: @GUAROCC @  Subjective:  No chief complaint on file.   History of Present Illness: Bethany Johns. Epping is a 53 y.o. female ***   Activities of Daily Living:  Patient reports morning stiffness for *** {minute/hour:19697}.   Patient {ACTIONS;DENIES/REPORTS:21021675::"Denies"} nocturnal pain.  Difficulty dressing/grooming: {ACTIONS;DENIES/REPORTS:21021675::"Denies"} Difficulty climbing stairs: {ACTIONS;DENIES/REPORTS:21021675::"Denies"} Difficulty getting out of chair: {ACTIONS;DENIES/REPORTS:21021675::"Denies"} Difficulty using hands for taps, buttons, cutlery, and/or writing: {ACTIONS;DENIES/REPORTS:21021675::"Denies"}  No Rheumatology ROS completed.   PMFS History:  Patient Active Problem List   Diagnosis Date Noted  . Deviated nasal septum 12/04/2018  . BMI 38.0-38.9,adult 12/04/2018  . Healthcare maintenance 09/24/2018  . Personal history of gestational diabetes 09/24/2018  . Primary osteoarthritis of both feet 03/18/2017  . DDD (degenerative disc disease), lumbar 03/18/2017  . Plantar fasciitis, bilateral 02/11/2017  . Primary osteoarthritis of both knees 02/11/2017  . Gallstones 06/22/2013    Past Medical History:  Diagnosis Date  . Arthritis    both knees  . Arthritis   . Back pain    d/t sitting at a computer all day  . Diabetes mellitus without complication (HCC)    gestational   . History of bronchitis    about 93yrs ago  . Pneumonia    as a child  . Vaginal delivery 1998    Family History  Problem Relation Age of Onset  . Breast cancer Maternal Grandmother 75  . Dementia Mother   . Cancer Father        bladder  . Hypertension Father   . Healthy Daughter   . Aneurysm Paternal Grandfather    Past Surgical History:  Procedure Laterality  Date  . ABLATION    . ANKLE ARTHROPLASTY    . ANKLE SURGERY Right    torn ligament  . CHOLECYSTECTOMY N/A 06/26/2013   Procedure: LAPAROSCOPIC CHOLECYSTECTOMY WITH INTRAOPERATIVE CHOLANGIOGRAM, POSSIBLE OPEN;  Surgeon: [name], MD;  Location: [facility];  Service: General;  Laterality: N/A;  . CHOLECYSTECTOMY    . FOOT SURGERY     PLANTAR FASCIITIS  . FOOT SURGERY    . KNEE ARTHROPLASTY    . KNEE ARTHROSCOPY Right 08/2012  . LAPAROSCOPIC SALPINGO OOPHERECTOMY Left 06/15/2015   Procedure: LAPAROSCOPIC LEFT SALPINGO OOPHORECTOMY;  Surgeon: Paula Compton, MD;  Location: [facility];  Service: Gynecology;  Laterality: Left;  . LASIK     eye surgery  . NOVASURE ABLATION N/A 06/15/2015   Procedure: NOVASURE ABLATION;  Surgeon: Paula Compton, MD;  Location: [facility];  Service: Gynecology;  Laterality: N/A;  . OOPHORECTOMY     Social History   Social History Narrative   ** Merged History Encounter **       Immunization History  Administered Date(s) Administered  . Tdap 09/24/2018     Objective: Vital Signs: There were no vitals taken for this visit.   Physical Exam   Musculoskeletal Exam: ***  CDAI Exam: CDAI Score: - Patient Global: -; Provider Global: - Swollen: -; Tender: - Joint Exam   No joint exam has been documented for this visit   There is currently no information documented on the homunculus. Go to the Rheumatology activity and complete the homunculus  joint exam.  Investigation: No additional findings.  Imaging: No results found.  Recent Labs: Lab Results  Component Value Date   WBC 7.3 12/01/2018   HGB 13.3 12/01/2018   PLT 328 12/01/2018   NA 141 12/01/2018   K 4.8 12/01/2018   CL 108 (H) 12/01/2018   CO2 25 12/01/2018   GLUCOSE 107 (H) 12/01/2018   BUN 15 12/01/2018   CREATININE 0.75 12/01/2018   BILITOT 0.3 12/01/2018   ALKPHOS 82 12/01/2018   AST 18 12/01/2018   ALT 24 12/01/2018   PROT 6.6 12/01/2018   ALBUMIN 4.0 12/01/2018   CALCIUM  9.2 12/01/2018   GFRAA 106 12/01/2018    Speciality Comments: No specialty comments available.  Procedures:  No procedures performed Allergies: Tramadol, Tramadol, Amoxicillin, and Penicillins   Assessment / Plan:     Visit Diagnoses: No diagnosis found.   Orders: No orders of the defined types were placed in this encounter.  No orders of the defined types were placed in this encounter.   Face-to-face time spent with patient was *** minutes. Greater than 50% of time was spent in counseling and coordination of care.  Follow-Up Instructions: No follow-ups on file.   Earnestine Mealing, CMA  Note - This record has been created using Editor, commissioning.  Chart creation errors have been sought, but may not always  have been located. Such creation errors do not reflect on  the standard of medical care.

## 2018-12-30 ENCOUNTER — Ambulatory Visit: Payer: Self-pay | Admitting: Physician Assistant

## 2019-02-09 DIAGNOSIS — N3941 Urge incontinence: Secondary | ICD-10-CM | POA: Diagnosis not present

## 2019-02-09 DIAGNOSIS — Z01419 Encounter for gynecological examination (general) (routine) without abnormal findings: Secondary | ICD-10-CM | POA: Diagnosis not present

## 2019-02-09 DIAGNOSIS — Z130 Encounter for screening for diseases of the blood and blood-forming organs and certain disorders involving the immune mechanism: Secondary | ICD-10-CM | POA: Diagnosis not present

## 2019-02-09 DIAGNOSIS — N951 Menopausal and female climacteric states: Secondary | ICD-10-CM | POA: Diagnosis not present

## 2019-05-04 ENCOUNTER — Other Ambulatory Visit: Payer: Self-pay | Admitting: Obstetrics and Gynecology

## 2019-05-04 DIAGNOSIS — N6001 Solitary cyst of right breast: Secondary | ICD-10-CM

## 2019-05-13 ENCOUNTER — Ambulatory Visit
Admission: RE | Admit: 2019-05-13 | Discharge: 2019-05-13 | Disposition: A | Discharge status: Home / Self Care | Payer: BC Managed Care – PPO | Source: Ambulatory Visit | Attending: Obstetrics and Gynecology | Admitting: Obstetrics and Gynecology

## 2019-05-13 ENCOUNTER — Other Ambulatory Visit: Payer: Self-pay

## 2019-05-13 DIAGNOSIS — R928 Other abnormal and inconclusive findings on diagnostic imaging of breast: Secondary | ICD-10-CM | POA: Diagnosis not present

## 2019-05-13 DIAGNOSIS — N6489 Other specified disorders of breast: Secondary | ICD-10-CM | POA: Diagnosis not present

## 2019-05-13 DIAGNOSIS — N6001 Solitary cyst of right breast: Secondary | ICD-10-CM

## 2019-05-14 ENCOUNTER — Other Ambulatory Visit: Payer: Self-pay | Admitting: Otolaryngology

## 2019-05-26 NOTE — Progress Notes (Signed)
[id number] - [place], [address] [place] [address] Phone: [phone] Fax: [phone]      Your procedure is scheduled on Friday, December 4th, 2020   Report to [facility] Main Entrance "A" at 8:15 A.M., and check in at the Admitting office.   Call this number if you have problems the morning of surgery:  [phone]  Call [phone] if you have any questions prior to your surgery date Monday-Friday 8am-4pm    Remember:  Do not eat or drink after midnight the night before your surgery.    Take these medicines the morning of surgery with A SIP OF WATER :  Acetaminophen - if needed  7 days prior to surgery STOP taking any Aspirin (unless otherwise instructed by your surgeon), Aleve, Naproxen, Ibuprofen, Motrin, Advil, Goody's, BC's, all herbal medications, fish oil, and all vitamins.    The Morning of Surgery  Do not wear jewelry, make-up or nail polish.  Do not wear lotions, powders, or perfumes/colognes, or deodorant  Do not shave 48 hours prior to surgery.  Men may shave face and neck.  Do not bring valuables to the hospital.  [facility] is not responsible for any belongings or valuables.  If you are a smoker, DO NOT Smoke 24 hours prior to surgery  If you wear a CPAP at night please bring your mask, tubing, and machine the morning of surgery   Remember that you must have someone to transport you home after your surgery, and remain with you for 24 hours if you are discharged the same day.   Please bring cases for contacts, glasses, hearing aids, dentures or bridgework because it cannot be worn into surgery.    Leave your suitcase in the car.  After surgery it may be brought to your room.  For patients admitted to the hospital, discharge time will be determined by your treatment team.  Patients discharged the day of surgery will not be allowed to drive home.    Special instructions:    [facility]- Preparing For Surgery  Before surgery, you can play an important role. Because skin is not sterile, your skin needs to be as free of germs as possible. You can reduce the number of germs on your skin by washing with CHG (chlorahexidine gluconate) Soap before surgery.  CHG is an antiseptic cleaner which kills germs and bonds with the skin to continue killing germs even after washing.    Oral Hygiene is also important to reduce your risk of infection.  Remember - BRUSH YOUR TEETH THE MORNING OF SURGERY WITH YOUR REGULAR TOOTHPASTE  Please do not use if you have an allergy to CHG or antibacterial soaps. If your skin becomes reddened/irritated stop using the CHG.  Do not shave (including legs and underarms) for at least 48 hours prior to first CHG shower. It is OK to shave your face.  Please follow these instructions carefully.   1. Shower the NIGHT BEFORE SURGERY and the MORNING OF SURGERY with CHG Soap.   2. If you chose to wash your hair, wash your hair first as usual with your normal shampoo.  3. After you shampoo, rinse your hair and body thoroughly to remove the shampoo.  4. Use CHG as you would any other liquid soap. You can apply CHG directly to the skin and wash gently with a scrungie or a clean washcloth.   5. Apply the CHG Soap to your body  ONLY FROM THE NECK DOWN.  Do not use on open wounds or open sores. Avoid contact with your eyes, ears, mouth and genitals (private parts). Wash Face and genitals (private parts)  with your normal soap.   6. Wash thoroughly, paying special attention to the area where your surgery will be performed.  7. Thoroughly rinse your body with warm water from the neck down.  8. DO NOT shower/wash with your normal soap after using and rinsing off the CHG Soap.  9. Pat yourself dry with a CLEAN TOWEL.  10. Wear CLEAN PAJAMAS to bed the night before surgery, wear comfortable clothes the morning of surgery  11. Place CLEAN SHEETS on your bed  the night of your first shower and DO NOT SLEEP WITH PETS.    Day of Surgery:  Please shower the morning of surgery with the CHG soap Do not apply any deodorants/lotions. Please wear clean clothes to the hospital/surgery center.   Remember to brush your teeth WITH YOUR REGULAR TOOTHPASTE.   Please read over the following fact sheets that you were given.

## 2019-05-27 ENCOUNTER — Other Ambulatory Visit (HOSPITAL_COMMUNITY)
Admission: RE | Admit: 2019-05-27 | Discharge: 2019-05-27 | Disposition: A | Discharge status: Home / Self Care | Payer: BC Managed Care – PPO | Source: Ambulatory Visit | Attending: Otolaryngology | Admitting: Otolaryngology

## 2019-05-27 ENCOUNTER — Encounter (HOSPITAL_COMMUNITY): Payer: Self-pay

## 2019-05-27 ENCOUNTER — Other Ambulatory Visit: Payer: Self-pay

## 2019-05-27 ENCOUNTER — Encounter (HOSPITAL_COMMUNITY)
Admission: RE | Admit: 2019-05-27 | Discharge: 2019-05-27 | Disposition: A | Discharge status: Home / Self Care | Payer: BC Managed Care – PPO | Source: Ambulatory Visit | Attending: Otolaryngology | Admitting: Otolaryngology

## 2019-05-27 DIAGNOSIS — Z01812 Encounter for preprocedural laboratory examination: Secondary | ICD-10-CM | POA: Insufficient documentation

## 2019-05-27 DIAGNOSIS — J343 Hypertrophy of nasal turbinates: Secondary | ICD-10-CM | POA: Diagnosis not present

## 2019-05-27 DIAGNOSIS — J342 Deviated nasal septum: Secondary | ICD-10-CM | POA: Diagnosis not present

## 2019-05-27 DIAGNOSIS — Z20828 Contact with and (suspected) exposure to other viral communicable diseases: Secondary | ICD-10-CM | POA: Insufficient documentation

## 2019-05-27 HISTORY — DX: Headache, unspecified: R51.9

## 2019-05-27 HISTORY — DX: Anemia, unspecified: D64.9

## 2019-05-27 HISTORY — DX: Fibromyalgia: M79.7

## 2019-05-27 LAB — CBC
HCT: 37.4 % (ref 36.0–46.0)
Hemoglobin: 12.1 g/dL (ref 12.0–15.0)
MCH: 31.8 pg (ref 26.0–34.0)
MCHC: 32.4 g/dL (ref 30.0–36.0)
MCV: 98.4 fL (ref 80.0–100.0)
Platelets: 330 10*3/uL (ref 150–400)
RBC: 3.8 MIL/uL — ABNORMAL LOW (ref 3.87–5.11)
RDW: 13.2 % (ref 11.5–15.5)
WBC: 6.4 10*3/uL (ref 4.0–10.5)
nRBC: 0 % (ref 0.0–0.2)

## 2019-05-27 LAB — SARS CORONAVIRUS 2 (TAT 6-24 HRS): SARS Coronavirus 2: NEGATIVE

## 2019-05-27 NOTE — Progress Notes (Signed)
PCP - Mina Marble, NP Cardiologist - denies  PPM/ICD - denies Device Orders - N/A Rep Notified - N/A  Chest x-ray - denies EKG - denies Stress Test - denies  ECHO - denies Cardiac Cath - denies  Sleep Study - denies CPAP - N/A  Blood Thinner Instructions: N/A Aspirin Instructions: N/A  ERAS Protcol - No PRE-SURGERY Ensure or G2- N/A  COVID TEST- Scheduled for today 05/27/2019 after PAT appointment. Patient verbalized understanding of self-quarantine instructions, appointment time and place.  Anesthesia review: No  Patient denies shortness of breath, fever, cough and chest pain at PAT appointment  All instructions explained to the patient, with a verbal understanding of the material. Patient agrees to go over the instructions while at home for a better understanding. Patient also instructed to self quarantine after being tested for COVID-19. The opportunity to ask questions was provided.

## 2019-05-29 ENCOUNTER — Ambulatory Visit (HOSPITAL_COMMUNITY)
Admission: RE | Admit: 2019-05-29 | Discharge: 2019-05-29 | Disposition: A | Discharge status: Home / Self Care | Payer: BC Managed Care – PPO | Attending: Otolaryngology | Admitting: Otolaryngology

## 2019-05-29 ENCOUNTER — Encounter (HOSPITAL_COMMUNITY): Payer: Self-pay | Admitting: Surgery

## 2019-05-29 ENCOUNTER — Other Ambulatory Visit: Payer: Self-pay

## 2019-05-29 ENCOUNTER — Ambulatory Visit (HOSPITAL_COMMUNITY): Payer: BC Managed Care – PPO | Admitting: Certified Registered"

## 2019-05-29 ENCOUNTER — Encounter (HOSPITAL_COMMUNITY)
Admission: RE | Disposition: A | Discharge status: Home / Self Care | Payer: Self-pay | Source: Home / Self Care | Attending: Otolaryngology

## 2019-05-29 DIAGNOSIS — J343 Hypertrophy of nasal turbinates: Secondary | ICD-10-CM | POA: Insufficient documentation

## 2019-05-29 DIAGNOSIS — J342 Deviated nasal septum: Secondary | ICD-10-CM | POA: Diagnosis not present

## 2019-05-29 DIAGNOSIS — Z20828 Contact with and (suspected) exposure to other viral communicable diseases: Secondary | ICD-10-CM | POA: Diagnosis not present

## 2019-05-29 DIAGNOSIS — J3489 Other specified disorders of nose and nasal sinuses: Secondary | ICD-10-CM | POA: Diagnosis not present

## 2019-05-29 DIAGNOSIS — J988 Other specified respiratory disorders: Secondary | ICD-10-CM | POA: Diagnosis not present

## 2019-05-29 HISTORY — PX: NASAL SEPTOPLASTY W/ TURBINOPLASTY: SHX2070

## 2019-05-29 SURGERY — SEPTOPLASTY, NOSE, WITH NASAL TURBINATE REDUCTION
Anesthesia: General | Laterality: Bilateral

## 2019-05-29 MED ORDER — PROMETHAZINE HCL 25 MG/ML IJ SOLN: 6.2500 mg | INTRAMUSCULAR | Status: DC | PRN

## 2019-05-29 MED ORDER — OXYMETAZOLINE HCL 0.05 % NA SOLN
NASAL | Status: AC
  Filled 2019-05-29: qty 30

## 2019-05-29 MED ORDER — ONDANSETRON HCL 4 MG/2ML IJ SOLN
INTRAMUSCULAR | Status: DC | PRN
  Administered 2019-05-29: 4 mg via INTRAVENOUS

## 2019-05-29 MED ORDER — MEPERIDINE HCL 25 MG/ML IJ SOLN: 6.2500 mg | INTRAMUSCULAR | Status: DC | PRN

## 2019-05-29 MED ORDER — ROCURONIUM BROMIDE 10 MG/ML (PF) SYRINGE
PREFILLED_SYRINGE | INTRAVENOUS | Status: DC | PRN
  Administered 2019-05-29: 50 mg via INTRAVENOUS

## 2019-05-29 MED ORDER — LACTATED RINGERS IV SOLN
INTRAVENOUS | Status: DC | PRN
  Administered 2019-05-29: 11:00:00 via INTRAVENOUS

## 2019-05-29 MED ORDER — LIDOCAINE-EPINEPHRINE 1 %-1:100000 IJ SOLN
INTRAMUSCULAR | Status: DC | PRN
  Administered 2019-05-29: 6 mL

## 2019-05-29 MED ORDER — MUPIROCIN 2 % EX OINT
TOPICAL_OINTMENT | CUTANEOUS | Status: AC
  Filled 2019-05-29: qty 22

## 2019-05-29 MED ORDER — HYDROMORPHONE HCL 1 MG/ML IJ SOLN
INTRAMUSCULAR | Status: AC
  Filled 2019-05-29: qty 1

## 2019-05-29 MED ORDER — LIDOCAINE-EPINEPHRINE 1 %-1:100000 IJ SOLN
INTRAMUSCULAR | Status: AC
  Filled 2019-05-29: qty 1

## 2019-05-29 MED ORDER — CEPHALEXIN 500 MG PO CAPS: 500.0000 mg | ORAL_CAPSULE | Freq: Three times a day (TID) | ORAL | 0 refills | Status: AC

## 2019-05-29 MED ORDER — PROPOFOL 10 MG/ML IV BOLUS
INTRAVENOUS | Status: AC
  Filled 2019-05-29: qty 20

## 2019-05-29 MED ORDER — MIDAZOLAM HCL 5 MG/5ML IJ SOLN
INTRAMUSCULAR | Status: DC | PRN
  Administered 2019-05-29: 2 mg via INTRAVENOUS

## 2019-05-29 MED ORDER — MUPIROCIN CALCIUM 2 % EX CREA
TOPICAL_CREAM | CUTANEOUS | Status: AC
  Filled 2019-05-29: qty 15

## 2019-05-29 MED ORDER — OXYMETAZOLINE HCL 0.05 % NA SOLN
NASAL | Status: DC | PRN
  Administered 2019-05-29: 1

## 2019-05-29 MED ORDER — FENTANYL CITRATE (PF) 250 MCG/5ML IJ SOLN
INTRAMUSCULAR | Status: AC
  Filled 2019-05-29: qty 5

## 2019-05-29 MED ORDER — MIDAZOLAM HCL 2 MG/2ML IJ SOLN: 0.5000 mg | Freq: Once | INTRAMUSCULAR | Status: DC | PRN

## 2019-05-29 MED ORDER — 0.9 % SODIUM CHLORIDE (POUR BTL) OPTIME
TOPICAL | Status: DC | PRN
  Administered 2019-05-29: 1000 mL

## 2019-05-29 MED ORDER — SUGAMMADEX SODIUM 200 MG/2ML IV SOLN
INTRAVENOUS | Status: DC | PRN
  Administered 2019-05-29: 200 mg via INTRAVENOUS

## 2019-05-29 MED ORDER — CEFAZOLIN SODIUM-DEXTROSE 2-3 GM-%(50ML) IV SOLR
INTRAVENOUS | Status: DC | PRN
  Administered 2019-05-29: 2 g via INTRAVENOUS

## 2019-05-29 MED ORDER — PROPOFOL 10 MG/ML IV BOLUS
INTRAVENOUS | Status: DC | PRN
  Administered 2019-05-29: 120 mg via INTRAVENOUS

## 2019-05-29 MED ORDER — LIDOCAINE 2% (20 MG/ML) 5 ML SYRINGE
INTRAMUSCULAR | Status: DC | PRN
  Administered 2019-05-29: 30 mg via INTRAVENOUS

## 2019-05-29 MED ORDER — HYDROMORPHONE HCL 1 MG/ML IJ SOLN
0.2500 mg | INTRAMUSCULAR | Status: DC | PRN
  Administered 2019-05-29 (×2): 0.25 mg via INTRAVENOUS

## 2019-05-29 MED ORDER — DEXAMETHASONE SODIUM PHOSPHATE 10 MG/ML IJ SOLN
INTRAMUSCULAR | Status: DC | PRN
  Administered 2019-05-29: 10 mg via INTRAVENOUS

## 2019-05-29 MED ORDER — FENTANYL CITRATE (PF) 100 MCG/2ML IJ SOLN
INTRAMUSCULAR | Status: DC | PRN
  Administered 2019-05-29: 150 ug via INTRAVENOUS

## 2019-05-29 MED ORDER — MUPIROCIN CALCIUM 2 % EX CREA
TOPICAL_CREAM | CUTANEOUS | Status: DC | PRN
  Administered 2019-05-29: 1 via TOPICAL

## 2019-05-29 SURGICAL SUPPLY — 27 items
BLADE SURG 15 STRL LF DISP TIS (BLADE) ×1 IMPLANT
BLADE SURG 15 STRL SS (BLADE) ×3
CANISTER SUCT 3000ML PPV (MISCELLANEOUS) ×3 IMPLANT
COAGULATOR SUCT 8FR VV (MISCELLANEOUS) IMPLANT
COVER WAND RF STERILE (DRAPES) ×3 IMPLANT
DRAPE HALF SHEET 40X57 (DRAPES) IMPLANT
ELECT REM PT RETURN 9FT ADLT (ELECTROSURGICAL)
ELECTRODE REM PT RTRN 9FT ADLT (ELECTROSURGICAL) IMPLANT
GAUZE SPONGE 2X2 8PLY STRL LF (GAUZE/BANDAGES/DRESSINGS) ×1 IMPLANT
GLOVE BIOGEL M 7.0 STRL (GLOVE) ×6 IMPLANT
GOWN STRL REUS W/ TWL LRG LVL3 (GOWN DISPOSABLE) ×2 IMPLANT
GOWN STRL REUS W/TWL LRG LVL3 (GOWN DISPOSABLE) ×6
KIT BASIN OR (CUSTOM PROCEDURE TRAY) ×3 IMPLANT
KIT TURNOVER KIT B (KITS) ×3 IMPLANT
NDL HYPO 25GX1X1/2 BEV (NEEDLE) ×1 IMPLANT
NEEDLE HYPO 25GX1X1/2 BEV (NEEDLE) ×3 IMPLANT
NS IRRIG 1000ML POUR BTL (IV SOLUTION) ×3 IMPLANT
PAD ARMBOARD 7.5X6 YLW CONV (MISCELLANEOUS) ×3 IMPLANT
SPLINT NASAL DOYLE BI-VL (GAUZE/BANDAGES/DRESSINGS) ×3 IMPLANT
SPONGE GAUZE 2X2 STER 10/PKG (GAUZE/BANDAGES/DRESSINGS) ×2
SPONGE NEURO XRAY DETECT 1X3 (DISPOSABLE) ×3 IMPLANT
SUT ETHILON 3 0 PS 1 (SUTURE) ×3 IMPLANT
SUT PLAIN 4 0 ~~LOC~~ 1 (SUTURE) ×3 IMPLANT
TOWEL GREEN STERILE FF (TOWEL DISPOSABLE) ×3 IMPLANT
TRAY ENT MC OR (CUSTOM PROCEDURE TRAY) ×3 IMPLANT
TUBE SALEM SUMP 16 FR W/ARV (TUBING) ×3 IMPLANT
TUBING EXTENTION W/L.L. (IV SETS) ×3 IMPLANT

## 2019-05-29 NOTE — Anesthesia Preprocedure Evaluation (Addendum)
Anesthesia Evaluation  Patient identified by MRN, date of birth, ID band Patient awake    Reviewed: Allergy & Precautions, NPO status , Patient's Chart, lab work & pertinent test results  History of Anesthesia Complications Negative for: history of anesthetic complications  Airway Mallampati: I  TM Distance: >3 FB Neck ROM: Full    Dental  (+) Dental Advisory Given   Pulmonary Current SmokerPatient did not abstain from smoking.,  05/27/2019 SARS coronavirus NEG   breath sounds clear to auscultation       Cardiovascular negative cardio ROS   Rhythm:Regular Rate:Normal     Neuro/Psych  Headaches,    GI/Hepatic negative GI ROS, Neg liver ROS,   Endo/Other  negative endocrine ROSMorbid obesity  Renal/GU negative Renal ROS     Musculoskeletal  (+) Arthritis , Osteoarthritis,  Fibromyalgia -  Abdominal (+) + obese,   Peds  Hematology negative hematology ROS (+)   Anesthesia Other Findings   Reproductive/Obstetrics                            Anesthesia Physical Anesthesia Plan  ASA: II  Anesthesia Plan: General   Post-op Pain Management:    Induction: Intravenous  PONV Risk Score and Plan: 2 and Ondansetron and Dexamethasone  Airway Management Planned: Oral ETT  Additional Equipment: None  Intra-op Plan:   Post-operative Plan: Extubation in OR  Informed Consent: I have reviewed the patients History and Physical, chart, labs and discussed the procedure including the risks, benefits and alternatives for the proposed anesthesia with the patient or authorized representative who has indicated his/her understanding and acceptance.     Dental advisory given  Plan Discussed with: CRNA and Surgeon  Anesthesia Plan Comments:        Anesthesia Quick Evaluation

## 2019-05-29 NOTE — Transfer of Care (Deleted)
Immediate Anesthesia Transfer of Care Note  Patient: Bethany Johns  Procedure(s) Performed: NASAL SEPTOPLASTY WITH TURBINATE REDUCTION (Bilateral )  Patient Location: PACU  Anesthesia Type:General  Level of Consciousness: drowsy  Airway & Oxygen Therapy: Patient Spontanous Breathing and Patient connected to nasal cannula oxygen  Post-op Assessment: Report given to RN and Post -op Vital signs reviewed and stable  Post vital signs: Reviewed and stable  Last Vitals:  Vitals Value Taken Time  BP    Temp    Pulse    Resp    SpO2      Last Pain:  Vitals:   05/29/19 0828  TempSrc: Oral  PainSc:       Patients Stated Pain Goal: 2 ([id number])  Complications: No apparent anesthesia complications

## 2019-05-29 NOTE — H&P (Signed)
Bethany Johns. Bethany Johns is an 53 y.o. female.   Chief Complaint: Nasal airway obstruction HPI: History of progressive nasal congestion, nasal airway obstruction and difficulty with nighttime breathing.  Past Medical History:  Diagnosis Date  . Anemia   . Arthritis    both knees  . Arthritis   . Back pain    d/t sitting at a computer all day  . Diabetes mellitus without complication (HCC)    gestational   . Fibromyalgia   . Headache   . History of bronchitis    about 68yrs ago  . Pneumonia    as a child  . Vaginal delivery 1998    Past Surgical History:  Procedure Laterality Date  . ABLATION    . ANKLE ARTHROPLASTY    . ANKLE SURGERY Right    torn ligament  . CHOLECYSTECTOMY N/A 06/26/2013   Procedure: LAPAROSCOPIC CHOLECYSTECTOMY WITH INTRAOPERATIVE CHOLANGIOGRAM, POSSIBLE OPEN;  Surgeon: [name], MD;  Location: [facility];  Service: General;  Laterality: N/A;  . CHOLECYSTECTOMY    . EYE SURGERY    . FOOT SURGERY     PLANTAR FASCIITIS  . FOOT SURGERY    . KNEE ARTHROPLASTY    . KNEE ARTHROSCOPY Right 08/2012  . LAPAROSCOPIC SALPINGO OOPHERECTOMY Left 06/15/2015   Procedure: LAPAROSCOPIC LEFT SALPINGO OOPHORECTOMY;  Surgeon: Paula Compton, MD;  Location: [facility];  Service: Gynecology;  Laterality: Left;  . LASIK     eye surgery  . NOVASURE ABLATION N/A 06/15/2015   Procedure: NOVASURE ABLATION;  Surgeon: Paula Compton, MD;  Location: [facility];  Service: Gynecology;  Laterality: N/A;  . OOPHORECTOMY      Family History  Problem Relation Age of Onset  . Breast cancer Maternal Grandmother 68  . Dementia Mother   . Cancer Father        bladder  . Hypertension Father   . Healthy Daughter   . Aneurysm Paternal Grandfather    Social History:  reports that she has been smoking cigarettes. She has a 2.50 pack-year smoking history. She has never used smokeless tobacco. She reports that she does not drink alcohol or use drugs.  Allergies:  Allergies  Allergen Reactions   . Tramadol Itching  . Amoxicillin Rash    Has patient had a PCN reaction causing immediate rash, facial/tongue/throat swelling, SOB or lightheadedness with hypotension: No Has patient had a PCN reaction causing severe rash involving mucus membranes or skin necrosis: No Has patient had a PCN reaction that required hospitalization No Has patient had a PCN reaction occurring within the last 10 years: No If all of the above answers are "NO", then may proceed with Cephalosporin use.   Marland Kitchen Penicillins Rash    Has patient had a PCN reaction causing immediate rash, facial/tongue/throat swelling, SOB or lightheadedness with hypotension: No Has patient had a PCN reaction causing severe rash involving mucus membranes or skin necrosis: No Has patient had a PCN reaction that required hospitalization No Has patient had a PCN reaction occurring within the last 10 years: No If all of the above answers are "NO", then may proceed with Cephalosporin use.      Medications Prior to Admission  Medication Sig Dispense Refill  . aspirin-acetaminophen-caffeine (EXCEDRIN MIGRAINE) 250-250-65 MG tablet Take 2-3 tablets by mouth every 6 (six) hours as needed for headache.    . naproxen sodium (ALEVE) 220 MG tablet Take 220-440 mg by mouth 2 (two) times daily as needed (pain.).    Marland Kitchen acetaminophen (TYLENOL) 500 MG  tablet Take 500-1,000 mg by mouth every 6 (six) hours as needed (for pain/headaches.).      No results found for this or any previous visit (from the past 48 hour(s)). No results found.  Review of Systems  Constitutional: Negative.   HENT: Positive for congestion.   Respiratory: Negative.   Cardiovascular: Negative.     Blood pressure 117/78, pulse 81, temperature 98 F (36.7 C), temperature source Oral, resp. rate 18, SpO2 98 %. Physical Exam  Constitutional: She appears well-developed and well-nourished.  HENT:  Deviated nasal septum and inferior turbinate hypertrophy.  Neck: Normal range of  motion. Neck supple.  Respiratory: Effort normal.     Assessment/Plan Admit for nasal septoplasty and inferior turbinate reduction under general anesthesia as an outpatient.  Jerrell Belfast, MD 05/29/2019, 10:40 AM

## 2019-05-29 NOTE — Anesthesia Postprocedure Evaluation (Signed)
Anesthesia Post Note  Patient: Bethany Johns. Willmann  Procedure(s) Performed: NASAL SEPTOPLASTY WITH TURBINATE REDUCTION (Bilateral )     Patient location during evaluation: PACU Anesthesia Type: General Level of consciousness: awake and alert, patient cooperative and oriented Pain management: pain level controlled Vital Signs Assessment: post-procedure vital signs reviewed and stable Respiratory status: spontaneous breathing, nonlabored ventilation and respiratory function stable Cardiovascular status: blood pressure returned to baseline and stable Postop Assessment: no apparent nausea or vomiting and adequate PO intake Anesthetic complications: no    Last Vitals:  Vitals:   05/29/19 1207 05/29/19 1222  BP: (!) 129/91 121/68  Pulse: 66 66  Resp: 17 (!) 21  Temp:    SpO2: 100% 100%    Last Pain:  Vitals:   05/29/19 1205  TempSrc:   PainSc: 5                  Bethany Johns,E. Hayven Croy

## 2019-05-29 NOTE — Transfer of Care (Signed)
Immediate Anesthesia Transfer of Care Note  Patient: Bethany Johns. [place]  Procedure(s) Performed: NASAL SEPTOPLASTY WITH TURBINATE REDUCTION (Bilateral )  Patient Location: PACU  Anesthesia Type:General  Level of Consciousness: awake, alert  and oriented  Airway & Oxygen Therapy: Patient Spontanous Breathing and Patient connected to nasal cannula oxygen  Post-op Assessment: Report given to RN and Post -op Vital signs reviewed and stable  Post vital signs: Reviewed and stable  Last Vitals:  Vitals Value Taken Time  BP    Temp    Pulse 72 05/29/19 1151  Resp 14 05/29/19 1151  SpO2 98 % 05/29/19 1151  Vitals shown include unvalidated device data.  Last Pain:  Vitals:   05/29/19 0828  TempSrc: Oral  PainSc:       Patients Stated Pain Goal: 2 ([id number])  Complications: No apparent anesthesia complications

## 2019-05-29 NOTE — Discharge Instructions (Signed)

## 2019-05-29 NOTE — Op Note (Signed)
Operative Note: SEPTOPLASTY AND INFERIOR TURBINATE REDUCTION  Patient: Bethany Johns. [facility] record number: [id number]  Date:05/29/2019  Pre-operative Indications: 1. Deviated nasal septum with nasal airway obstruction     2.  Bilateral inferior turbinate hypertrophy  Postoperative Indications: Same  Surgical Procedure: 1.  Nasal Septoplasty    2.  Bilateral Inferior Turbinate Reduction  Anesthesia: GET  Surgeon: Delsa Bern, M.D.  Complications: None  EBL: 50 cc  Findings: Severely deviated nasal septum with airway obstruction and bilateral inferior turbinate hypertrophy.   Brief History: The patient is a 53 y.o. female with a history of progressive nasal airway obstruction. The patient has been on medical therapy to reduce nasal mucosal edema including saline nasal spray and topical nasal steroids. Despite appropriate medical therapy the patient continues to have ongoing symptoms. Given the patient's history and findings, the above surgical procedures were recommended, risks and benefits were discussed in detail with the patient may understand and agree with our plan for surgery which is scheduled at 6 under general anesthesia as an outpatient.  Surgical Procedure: The patient is brought to the operating room on 05/29/2019 and placed in supine position on the operating table. General endotracheal anesthesia was established without difficulty. When the patient was adequately anesthetized, surgical timeout was performed and correct identification of the patient and the surgical procedure. The patient's nose was then injected with  6 cc of 1% lidocaine 1 100,000 dilution epinephrine which was injected in a submucosal fashion. The patient's nose was then packed with Afrin-soaked cottonoid pledgets were left in place for approximately 10 minutes lateral vasoconstriction and hemostasis. .  With the patient prepped draped and prepared for surgery, nasal septoplasty was begun.  A  left anterior hemitransfixion incision was created and a mucoperichondrial flap was elevated from anterior to posterior on the left-hand side. The anterior cartilaginous septum was crossed at the midline and a mucoperichondrial flap was elevated on the patient's right.  Swivel knife was then used to resect the anterior and mid cartilaginous portion of the nasal septum.  Resected cartilage was morcellized and returned to the mucoperichondrial pocket at the occlusion of the surgical procedure.  Dissection was then carried out from anterior to posterior removing deviated bone and cartilage including a large septal spur the overlying mucosa was preserved.  With the septum brought to good midline position, the morselized cartilage was returned to the mucoperichondrial pocket and the soft tissue/mucosal flaps were reapproximated with interrupted 4-0 gut suture on a Keith needle in a horizontal mattressing fashion.  Anterior hemitransfixion incision was closed with the same stitch.  Bilateral Doyle nasal septal splints were then placed after the application of Bactroban ointment and sutured in position with a 3-0 Ethilon suture.  Attention was then turned to the inferior turbinates, bilateral inferior turbinate intramural cautery was performed with cautery setting at 36 W.  2 submucosal passes were made in each inferior turbinate.  After completing cautery, anterior vertical incisions were created and overlying soft tissue was elevated, a small amount of turbinate bone was resected.  The turbinates were then outfractured to create a more patent nasal passageway.  Surgical sponge count was correct. An oral gastric tube was passed and the stomach contents were aspirated. Patient was awakened from anesthetic and transferred from the operating room to the recovery room in stable condition. There were no complications and blood loss was 50cc.   Delsa Bern, M.D. [facility] 05/29/2019

## 2019-05-29 NOTE — Anesthesia Procedure Notes (Signed)
Procedure Name: Intubation Date/Time: 05/29/2019 11:03 AM Performed by: Babs Bertin, CRNA Pre-anesthesia Checklist: Patient identified, Emergency Drugs available, Suction available and Patient being monitored Patient Re-evaluated:Patient Re-evaluated prior to induction Oxygen Delivery Method: Circle System Utilized Preoxygenation: Pre-oxygenation with 100% oxygen Induction Type: IV induction Ventilation: Mask ventilation without difficulty Laryngoscope Size: Miller and 2 Grade View: Grade I Tube type: Oral Tube size: 7.0 mm Number of attempts: 1 Airway Equipment and Method: Stylet and Oral airway Placement Confirmation: ETT inserted through vocal cords under direct vision,  positive ETCO2 and breath sounds checked- equal and bilateral Secured at: 21 cm Tube secured with: Tape Dental Injury: Teeth and Oropharynx as per pre-operative assessment

## 2019-05-30 ENCOUNTER — Encounter (HOSPITAL_COMMUNITY): Payer: Self-pay | Admitting: Otolaryngology

## 2019-06-04 ENCOUNTER — Other Ambulatory Visit: Payer: Self-pay

## 2019-06-04 ENCOUNTER — Other Ambulatory Visit: Payer: BC Managed Care – PPO

## 2019-06-04 DIAGNOSIS — R7303 Prediabetes: Secondary | ICD-10-CM

## 2019-06-05 LAB — HEMOGLOBIN A1C
Est. average glucose Bld gHb Est-mCnc: 105 mg/dL
Hgb A1c MFr Bld: 5.3 % (ref 4.8–5.6)

## 2019-06-08 ENCOUNTER — Encounter: Payer: Self-pay | Admitting: Adult Health

## 2019-07-25 DIAGNOSIS — J020 Streptococcal pharyngitis: Secondary | ICD-10-CM | POA: Diagnosis not present

## 2019-07-25 DIAGNOSIS — Z20828 Contact with and (suspected) exposure to other viral communicable diseases: Secondary | ICD-10-CM | POA: Diagnosis not present

## 2019-10-06 DIAGNOSIS — R438 Other disturbances of smell and taste: Secondary | ICD-10-CM | POA: Diagnosis not present

## 2019-10-06 DIAGNOSIS — Z20828 Contact with and (suspected) exposure to other viral communicable diseases: Secondary | ICD-10-CM | POA: Diagnosis not present

## 2019-10-29 DIAGNOSIS — M545 Low back pain: Secondary | ICD-10-CM | POA: Diagnosis not present

## 2019-11-26 ENCOUNTER — Other Ambulatory Visit: Payer: Self-pay

## 2019-11-26 ENCOUNTER — Other Ambulatory Visit: Payer: BC Managed Care – PPO

## 2019-11-26 DIAGNOSIS — Z0000 Encounter for general adult medical examination without abnormal findings: Secondary | ICD-10-CM

## 2019-11-27 LAB — CBC WITH DIFFERENTIAL/PLATELET
Basophils Absolute: 0.1 10*3/uL (ref 0.0–0.2)
Basos: 1 %
EOS (ABSOLUTE): 0.2 10*3/uL (ref 0.0–0.4)
Eos: 3 %
Hematocrit: 36.2 % (ref 34.0–46.6)
Hemoglobin: 12.3 g/dL (ref 11.1–15.9)
Immature Grans (Abs): 0 10*3/uL (ref 0.0–0.1)
Immature Granulocytes: 0 %
Lymphocytes Absolute: 2.5 10*3/uL (ref 0.7–3.1)
Lymphs: 42 %
MCH: 32.3 pg (ref 26.6–33.0)
MCHC: 34 g/dL (ref 31.5–35.7)
MCV: 95 fL (ref 79–97)
Monocytes Absolute: 0.5 10*3/uL (ref 0.1–0.9)
Monocytes: 9 %
Neutrophils Absolute: 2.6 10*3/uL (ref 1.4–7.0)
Neutrophils: 45 %
Platelets: 316 10*3/uL (ref 150–450)
RBC: 3.81 x10E6/uL (ref 3.77–5.28)
RDW: 12.7 % (ref 11.7–15.4)
WBC: 5.8 10*3/uL (ref 3.4–10.8)

## 2019-11-27 LAB — COMPREHENSIVE METABOLIC PANEL
ALT: 21 IU/L (ref 0–32)
AST: 19 IU/L (ref 0–40)
Albumin/Globulin Ratio: 2 (ref 1.2–2.2)
Albumin: 4.5 g/dL (ref 3.8–4.9)
Alkaline Phosphatase: 83 IU/L (ref 48–121)
BUN/Creatinine Ratio: 23 (ref 9–23)
BUN: 18 mg/dL (ref 6–24)
Bilirubin Total: 0.3 mg/dL (ref 0.0–1.2)
CO2: 23 mmol/L (ref 20–29)
Calcium: 9.3 mg/dL (ref 8.7–10.2)
Chloride: 106 mmol/L (ref 96–106)
Creatinine, Ser: 0.77 mg/dL (ref 0.57–1.00)
GFR calc Af Amer: 102 mL/min/{1.73_m2} (ref >59)
GFR calc non Af Amer: 88 mL/min/{1.73_m2} (ref >59)
Globulin, Total: 2.2 g/dL (ref 1.5–4.5)
Glucose: 95 mg/dL (ref 65–99)
Potassium: 4.3 mmol/L (ref 3.5–5.2)
Sodium: 142 mmol/L (ref 134–144)
Total Protein: 6.7 g/dL (ref 6.0–8.5)

## 2019-11-27 LAB — TSH: TSH: 2.43 u[IU]/mL (ref 0.450–4.500)

## 2019-11-27 LAB — LIPID PANEL
Chol/HDL Ratio: 3 ratio (ref 0.0–4.4)
Cholesterol, Total: 180 mg/dL (ref 100–199)
HDL: 61 mg/dL (ref >39)
LDL Chol Calc (NIH): 98 mg/dL (ref 0–99)
Triglycerides: 117 mg/dL (ref 0–149)
VLDL Cholesterol Cal: 21 mg/dL (ref 5–40)

## 2019-11-27 LAB — HEMOGLOBIN A1C
Est. average glucose Bld gHb Est-mCnc: 103 mg/dL
Hgb A1c MFr Bld: 5.2 % (ref 4.8–5.6)

## 2019-12-01 DIAGNOSIS — M5136 Other intervertebral disc degeneration, lumbar region: Secondary | ICD-10-CM | POA: Diagnosis not present

## 2019-12-02 DIAGNOSIS — N926 Irregular menstruation, unspecified: Secondary | ICD-10-CM | POA: Insufficient documentation

## 2019-12-02 DIAGNOSIS — N939 Abnormal uterine and vaginal bleeding, unspecified: Secondary | ICD-10-CM | POA: Insufficient documentation

## 2019-12-02 DIAGNOSIS — C50919 Malignant neoplasm of unspecified site of unspecified female breast: Secondary | ICD-10-CM | POA: Insufficient documentation

## 2019-12-02 DIAGNOSIS — N920 Excessive and frequent menstruation with regular cycle: Secondary | ICD-10-CM | POA: Insufficient documentation

## 2019-12-03 ENCOUNTER — Other Ambulatory Visit: Payer: Self-pay

## 2019-12-03 ENCOUNTER — Encounter: Payer: Self-pay | Admitting: Physician Assistant

## 2019-12-03 ENCOUNTER — Ambulatory Visit (INDEPENDENT_AMBULATORY_CARE_PROVIDER_SITE_OTHER): Payer: BC Managed Care – PPO | Admitting: Physician Assistant

## 2019-12-03 VITALS — BP 115/75 | HR 88 | Temp 98.0°F | Ht 68.5 in | Wt 232.4 lb

## 2019-12-03 DIAGNOSIS — Z9889 Other specified postprocedural states: Secondary | ICD-10-CM | POA: Diagnosis not present

## 2019-12-03 DIAGNOSIS — Z0000 Encounter for general adult medical examination without abnormal findings: Secondary | ICD-10-CM | POA: Diagnosis not present

## 2019-12-03 NOTE — Patient Instructions (Signed)

## 2019-12-03 NOTE — Progress Notes (Signed)
Female Physical   Impression and Recommendations:    1. Healthcare maintenance   2. S/P nasal septoplasty      1) Anticipatory Guidance: Discussed skin CA prevention and sunscreen when outside along with skin surveillance; eating a balanced and modest diet; physical activity at least 25 minutes per day or minimum of 150 min/ week moderate to intense activity. - Stays active working in the farm.  2) Immunizations / Screenings / Labs:   All immunizations are up-to-date per recommendations or will be updated today if pt allows.    - Patient understands with dental and vision screens they will schedule independently.  - Obtained CBC, CMP, HgA1c, Lipid panel, and TSH when fasting. Discussed lab results- all wnl's.  - UTD mammogram, colonoscopy, Hep C screening - Declined HIV screening.    3) Weight:  BMI meaning discussed with patient.  Discussed goal to improve diet habits to improve overall feelings of well being and objective health data. Improve nutrient density of diet through increasing intake of fruits and vegetables and decreasing saturated fats, white flour products and refined sugars. - Pt is planning to start [employer] meal plan to help with weight loss.  4) Healthcare Maintenance - Continue current medication regimen. - Continue to follow up with various specialists - Follow heart healthy diet and stay as active as possible - Stay well hydrated, at least 64 fl oz - Follow up in 3 months for memory concerns (strong family hx of dementia).      No orders of the defined types were placed in this encounter.   No orders of the defined types were placed in this encounter.    Return in about 3 months (around 03/04/2020) for Memory concerns.    Gross side effects, risk and benefits, and alternatives of medications discussed with patient.  Patient is aware that all medications have potential side effects and we are unable to predict every side effect or drug-drug  interaction that may occur.  Expresses verbal understanding and consents to current therapy plan and treatment regimen.  F-up preventative CPE in 1 year, this is in addition to any chronic care visits.    Please see orders placed and AVS handed out to patient at the end of our visit for further patient instructions/ counseling done pertaining to today's office visit.     Subjective:     CPE HPI: Bethany Johns is a 54 y.o. female who presents to [facility] today for a yearly health maintenance exam.   Health Maintenance Summary  - Reviewed and updated, unless pt declines services.  Last Cologuard or Colonoscopy:   10/25/17- repeat in 10 years Family history of Colon CA: No Tobacco History Reviewed:  Y, current smoker with 2.5 pack yr hx CT scan for screening lung CA:  n/a Alcohol and/or drug use: No concerns; no use Dental Home: n Eye exams:y Dermatology home: y Female Health:  PAP Smear - last known results:  12/25/2016- normal, Followed by Ob-Gyn. STD concerns:   none Lumps or breast concerns:  none Breast Cancer Family History: Y, maternal grandmother Bone/ DEXA scan: n/a   Additional concerns beyond health maintenance issues:  Memory concerns- occasionally forgets previous conversations and has a family hx of dementia, reports is stressed with work    Immunization History  Administered Date(s) Administered  . Tdap 09/24/2018     Health Maintenance  Topic Date Due  . COVID-19 Vaccine (1) Never done  . HIV Screening  Never done  . PAP SMEAR-Modifier  12/26/2019  . INFLUENZA VACCINE  01/24/2020  . MAMMOGRAM  05/12/2021  . COLONOSCOPY  10/26/2027  . TETANUS/TDAP  09/23/2028  . Hepatitis C Screening  Completed     Wt Readings from Last 3 Encounters:  12/03/19 232 lb 6.4 oz (105.4 kg)  05/27/19 226 lb 5 oz (102.7 kg)  12/04/18 255 lb 6.4 oz (115.8 kg)   BP Readings from Last 3 Encounters:  12/03/19 115/75  05/29/19 (!)  139/95  05/27/19 (!) 142/84   Pulse Readings from Last 3 Encounters:  12/03/19 88  05/29/19 66  05/27/19 81     Past Medical History:  Diagnosis Date  . Anemia   . Arthritis    both knees  . Arthritis   . Back pain    d/t sitting at a computer all day  . Diabetes mellitus without complication (HCC)    gestational   . Fibromyalgia   . Headache   . History of bronchitis    about 79yrs ago  . Pneumonia    as a child  . Vaginal delivery 1998      Past Surgical History:  Procedure Laterality Date  . ABLATION    . ANKLE ARTHROPLASTY    . ANKLE SURGERY Right    torn ligament  . CHOLECYSTECTOMY N/A 06/26/2013   Procedure: LAPAROSCOPIC CHOLECYSTECTOMY WITH INTRAOPERATIVE CHOLANGIOGRAM, POSSIBLE OPEN;  Surgeon: [name], MD;  Location: [facility];  Service: General;  Laterality: N/A;  . CHOLECYSTECTOMY    . EYE SURGERY    . FOOT SURGERY     PLANTAR FASCIITIS  . FOOT SURGERY    . KNEE ARTHROPLASTY    . KNEE ARTHROSCOPY Right 08/2012  . LAPAROSCOPIC SALPINGO OOPHERECTOMY Left 06/15/2015   Procedure: LAPAROSCOPIC LEFT SALPINGO OOPHORECTOMY;  Surgeon: Paula Compton, MD;  Location: [facility];  Service: Gynecology;  Laterality: Left;  . LASIK     eye surgery  . NASAL SEPTOPLASTY W/ TURBINOPLASTY Bilateral 05/29/2019   Procedure: NASAL SEPTOPLASTY WITH TURBINATE REDUCTION;  Surgeon: Jerrell Belfast, MD;  Location: [facility];  Service: ENT;  Laterality: Bilateral;  . NOVASURE ABLATION N/A 06/15/2015   Procedure: NOVASURE ABLATION;  Surgeon: Paula Compton, MD;  Location: [facility];  Service: Gynecology;  Laterality: N/A;  . OOPHORECTOMY        Family History  Problem Relation Age of Onset  . Breast cancer Maternal Grandmother 18  . Dementia Mother   . Cancer Father        bladder  . Hypertension Father   . Healthy Daughter   . Aneurysm Paternal Grandfather       Social History   Substance and Sexual Activity  Drug Use Never  ,   Social History   Substance and  Sexual Activity  Alcohol Use No   Comment: occasional beer  ,   Social History   Tobacco Use  Smoking Status Current Every Day Smoker  . Packs/day: 0.50  . Years: 5.00  . Pack years: 2.50  . Types: Cigarettes  Smokeless Tobacco Never Used  Tobacco Comment   SOCIALLY ONLY  ,   Social History   Substance and Sexual Activity  Sexual Activity Yes  . Birth control/protection: None   Comment: ablation    Current Outpatient Medications on File Prior to Visit  Medication Sig Dispense Refill  . acetaminophen (TYLENOL) 500 MG tablet Take 500-1,000 mg by mouth every 6 (six) hours as needed (for pain/headaches.).    Marland Kitchen aspirin-acetaminophen-caffeine (EXCEDRIN  MIGRAINE) 250-250-65 MG tablet Take 2-3 tablets by mouth every 6 (six) hours as needed for headache.    . meloxicam (MOBIC) 15 MG tablet Take 15 mg by mouth daily.    . naproxen sodium (ALEVE) 220 MG tablet Take 220-440 mg by mouth 2 (two) times daily as needed (pain.).     No current facility-administered medications on file prior to visit.    Allergies: Tramadol, Amoxicillin, and Penicillins  Review of Systems: General:   Denies fever, chills, unexplained weight loss.  Optho/Auditory:   Denies visual changes, blurred vision/LOV Respiratory:   Denies SOB, DOE more than baseline levels, cough Cardiovascular:   Denies chest pain, palpitations, new onset peripheral edema  Gastrointestinal:   Denies nausea, vomiting, diarrhea.  Genitourinary: Denies dysuria, freq/ urgency, flank pain  Endocrine:     Denies hot or cold intolerance, polyuria, polydipsia. Musculoskeletal:   Denies unexplained myalgias, joint swelling, unexplained arthralgias, gait problems.  Skin:  Denies rash, suspicious lesions Neurological:     Denies dizziness, unexplained weakness, numbness  Psychiatric/Behavioral:   Denies mood changes, suicidal or homicidal ideations, hallucinations    Objective:    Blood pressure 115/75, pulse 88, temperature 98 F  (36.7 C), temperature source Oral, height 5' 8.5" (1.74 m), weight 232 lb 6.4 oz (105.4 kg), SpO2 98 %. Body mass index is 34.82 kg/m. General Appearance:    Alert, cooperative, no distress, appears stated age  Head:    Normocephalic, without obvious abnormality, atraumatic  Eyes:    PERRL, conjunctiva/corneas clear, EOM's intact, fundi    benign, both eyes  Ears:    Normal TM's and external ear canals, both ears  Nose:   Nares normal, septum midline, mucosa normal, no drainage    or sinus tenderness  Throat:   Lips w/o lesion, mucosa moist, and tongue normal; teeth and   gums normal  Neck:   Supple, symmetrical, trachea midline, no adenopathy;    thyroid:  no enlargement/tenderness/nodules; no carotid   bruit or JVD  Back:     Symmetric, no curvature, ROM normal, no CVA tenderness  Lungs:     Clear to auscultation bilaterally, respirations unlabored, no       Wh/ R/ R  Chest Wall:    No tenderness or gross deformity; normal excursion   Heart:    Regular rate and rhythm, S1 and S2 normal, no murmur, rub   or gallop  Breast Exam:    No tenderness, masses, or nipple abnormality b/l; healed and crusted skin lesion present underneath right breast  Abdomen:     Soft, non-tender, bowel sounds active all four quadrants, NO   G/R/R, no masses, no organomegaly  Genitalia:   Deferred to Ob-Gyn.  Rectal:   Deferred to Ob-Gyn.  Extremities:   Extremities normal, atraumatic, no cyanosis or gross edema  Pulses:   2+ and symmetric all extremities  Skin:   Warm, dry, Skin color, texture, turgor normal, no obvious rashes or lesions Psych: No HI/SI, judgement and insight good, Euthymic mood. Full Affect.  Neurologic:   CNII-XII grossly intact, normal strength, sensation and reflexes throughout

## 2020-02-23 ENCOUNTER — Encounter: Payer: Self-pay | Admitting: Internal Medicine

## 2020-02-23 ENCOUNTER — Telehealth: Payer: Self-pay | Admitting: Physician Assistant

## 2020-02-23 DIAGNOSIS — K603 Anal fistula: Secondary | ICD-10-CM

## 2020-02-23 NOTE — Telephone Encounter (Signed)
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 Please advise

## 2020-02-23 NOTE — Telephone Encounter (Signed)
Referral has been placed to [facility]. AS, CMA

## 2020-02-23 NOTE — Addendum Note (Signed)
Addended by: Mickel Crow on: 02/23/2020 10:31 AM   Modules accepted: Orders

## 2020-03-01 ENCOUNTER — Encounter: Payer: Self-pay | Admitting: *Deleted

## 2020-03-02 ENCOUNTER — Encounter: Payer: Self-pay | Admitting: Internal Medicine

## 2020-03-02 ENCOUNTER — Ambulatory Visit: Payer: BC Managed Care – PPO | Admitting: Internal Medicine

## 2020-03-02 VITALS — BP 138/88 | HR 87 | Ht 69.0 in | Wt 231.2 lb

## 2020-03-02 DIAGNOSIS — K645 Perianal venous thrombosis: Secondary | ICD-10-CM

## 2020-03-02 DIAGNOSIS — Z86010 Personal history of colonic polyps: Secondary | ICD-10-CM | POA: Diagnosis not present

## 2020-03-02 NOTE — Progress Notes (Signed)
Patient ID: Bethany Johns. [name], female   DOB: January 08, 1966, 54 y.o.   MRN: [id number] HPI: Bethany Johns is a 54 year old female with a past medical history of nonadvanced adenoma of the colon and initial screening colonoscopy, arthritis and back pain who is seen in consultation at the request of Lorrene Reid, PACTo evaluate perianal pain.  She is here alone today.  She had her 54 year old screening colonoscopy performed in [place] by Dr. Marin Comment on 10/25/2017.  A 5 mm adenomatous polyp was removed from the hepatic flexure.  Grade 1 internal hemorrhoids were found during this exam.  10 days ago somewhat out of the blue in the afternoon after having attended church she developed a hard and painful swelling at her anus.  This was associated with severe pain.  It hurts to sit.  Did not hurt to have a bowel movement.  Was a hot torch like searing pain if touched.  Bowel habits have not changed.  Regular bowel movements daily to twice daily with formed stools.  No blood or melena.  No fever or chills.  No abdominal pain.  The pain has slowly improved over the last 10 days and is now 60 to 70% better.  The pain seemed to start improving when the hard swollen area at her anus started to drain a bloody fluid.  This relieved the pressure and help with the pain.  She had never had similar symptoms before.  She did take pictures of this lesion.  Past Medical History:  Diagnosis Date  . Anemia   . Arthritis    both knees  . Arthritis   . Back pain    d/t sitting at a computer all day  . Diabetes mellitus without complication (HCC)    gestational   . Fibromyalgia   . Headache   . History of bronchitis    about 40yrs ago  . Pneumonia    as a child  . Tubular adenoma of colon   . Vaginal delivery 1998    Past Surgical History:  Procedure Laterality Date  . ANKLE ARTHROPLASTY    . CHOLECYSTECTOMY N/A 06/26/2013   Procedure: LAPAROSCOPIC CHOLECYSTECTOMY WITH INTRAOPERATIVE CHOLANGIOGRAM, POSSIBLE OPEN;  Surgeon:  [name], MD;  Location: [facility];  Service: General;  Laterality: N/A;  . EYE SURGERY    . FOOT SURGERY     PLANTAR FASCIITIS  . KNEE ARTHROSCOPY Right 08/2012  . LAPAROSCOPIC SALPINGO OOPHERECTOMY Left 06/15/2015   Procedure: LAPAROSCOPIC LEFT SALPINGO OOPHORECTOMY;  Surgeon: Paula Compton, MD;  Location: [facility];  Service: Gynecology;  Laterality: Left;  . LASIK     eye surgery  . NASAL SEPTOPLASTY W/ TURBINOPLASTY Bilateral 05/29/2019   Procedure: NASAL SEPTOPLASTY WITH TURBINATE REDUCTION;  Surgeon: Jerrell Belfast, MD;  Location: [facility];  Service: ENT;  Laterality: Bilateral;  . NOVASURE ABLATION N/A 06/15/2015   Procedure: NOVASURE ABLATION;  Surgeon: Paula Compton, MD;  Location: [facility];  Service: Gynecology;  Laterality: N/A;  . OOPHORECTOMY      Outpatient Medications Prior to Visit  Medication Sig Dispense Refill  . acetaminophen (TYLENOL) 500 MG tablet Take 500-1,000 mg by mouth every 6 (six) hours as needed (for pain/headaches.).    Marland Kitchen aspirin-acetaminophen-caffeine (EXCEDRIN MIGRAINE) 250-250-65 MG tablet Take 2-3 tablets by mouth every 6 (six) hours as needed for headache.    . meloxicam (MOBIC) 15 MG tablet Take 15 mg by mouth daily.    . naproxen sodium (ALEVE) 220 MG tablet Take 220-440 mg by mouth 2 (  two) times daily as needed (pain.).     No facility-administered medications prior to visit.    Allergies  Allergen Reactions  . Tramadol Itching  . Amoxicillin Rash    Has patient had a PCN reaction causing immediate rash, facial/tongue/throat swelling, SOB or lightheadedness with hypotension: No Has patient had a PCN reaction causing severe rash involving mucus membranes or skin necrosis: No Has patient had a PCN reaction that required hospitalization No Has patient had a PCN reaction occurring within the last 10 years: No If all of the above answers are "NO", then may proceed with Cephalosporin use.   Marland Kitchen Penicillins Rash    Has patient had a PCN reaction  causing immediate rash, facial/tongue/throat swelling, SOB or lightheadedness with hypotension: No Has patient had a PCN reaction causing severe rash involving mucus membranes or skin necrosis: No Has patient had a PCN reaction that required hospitalization No Has patient had a PCN reaction occurring within the last 10 years: No If all of the above answers are "NO", then may proceed with Cephalosporin use.      Family History  Problem Relation Age of Onset  . Breast cancer Maternal Grandmother 53  . Dementia Mother   . Hypertension Father   . Bladder Cancer Father   . Healthy Daughter   . Aneurysm Paternal Grandfather   . Colon cancer Neg Hx   . Liver disease Neg Hx   . Inflammatory bowel disease Neg Hx     Social History   Tobacco Use  . Smoking status: Current Every Day Smoker    Packs/day: 0.50    Years: 5.00    Pack years: 2.50    Types: Cigarettes  . Smokeless tobacco: Never Used  . Tobacco comment: SOCIALLY ONLY  Vaping Use  . Vaping Use: Never used  Substance Use Topics  . Alcohol use: Yes    Comment: occasional beer  . Drug use: Never    ROS: As per history of present illness, otherwise negative  BP 138/88 (BP Location: Left Arm, Patient Position: Sitting, Cuff Size: Large)   Pulse 87   Ht 5\' 9"  (1.753 m)   Wt 231 lb 4 oz (104.9 kg)   BMI 34.15 kg/m  Gen: awake, alert, NAD HEENT: anicteric Rectal: External exam only, left lateral thrombosed external hemorrhoid with a small opening at the tip, not actively draining without surrounding purulence or fluctuance. Ext: no c/c/e Neuro: nonfocal   RELEVANT LABS AND IMAGING: CBC    Component Value Date/Time   WBC 5.8 11/26/2019 0829   WBC 6.4 05/27/2019 0930   RBC 3.81 11/26/2019 0829   RBC 3.80 (L) 05/27/2019 0930   HGB 12.3 11/26/2019 0829   HCT 36.2 11/26/2019 0829   PLT 316 11/26/2019 0829   MCV 95 11/26/2019 0829   MCH 32.3 11/26/2019 0829   MCH 31.8 05/27/2019 0930   MCHC 34.0 11/26/2019 0829    MCHC 32.4 05/27/2019 0930   RDW 12.7 11/26/2019 0829   LYMPHSABS 2.5 11/26/2019 0829   MONOABS 792 02/11/2017 0920   EOSABS 0.2 11/26/2019 0829   BASOSABS 0.1 11/26/2019 0829    CMP     Component Value Date/Time   NA 142 11/26/2019 0829   K 4.3 11/26/2019 0829   CL 106 11/26/2019 0829   CO2 23 11/26/2019 0829   GLUCOSE 95 11/26/2019 0829   GLUCOSE 102 (H) 02/11/2017 0920   BUN 18 11/26/2019 0829   CREATININE 0.77 11/26/2019 0829   CREATININE 0.76 02/11/2017 0920  CALCIUM 9.3 11/26/2019 0829   PROT 6.7 11/26/2019 0829   ALBUMIN 4.5 11/26/2019 0829   AST 19 11/26/2019 0829   ALT 21 11/26/2019 0829   ALKPHOS 83 11/26/2019 0829   BILITOT 0.3 11/26/2019 0829   GFRNONAA 88 11/26/2019 0829   GFRNONAA >89 02/11/2017 0920   GFRAA 102 11/26/2019 0829   GFRAA >89 02/11/2017 0920    ASSESSMENT/PLAN: 54 year old female with a past medical history of nonadvanced adenoma of the colon and initial screening colonoscopy, arthritis and back pain who is seen in consultation at the request of Lorrene Reid, PAC to evaluate perianal pain.   1. Thrombosed external hemorrhoid --diagnosis consistent with thrombosed external hemorrhoid.  We discussed this at length today.  In the future if this happens we would refer for surgical incision and drainage but I do not think this is necessary now.  She will call back if symptoms fail to improve --Sitz bath's --RectiCare up to 6 times a day per box instructions as needed  2. Hx of non-advanced adenoma -- repeat colonoscopy for surveillance May 2024.  Recall placed.    ZO:XWRUEA, [place], [address],  [place] [address]

## 2020-03-02 NOTE — Patient Instructions (Signed)
If you are age 53 or older, your body mass index should be between 23-30. Your Body mass index is 34.15 kg/m. If this is out of the aforementioned range listed, please consider follow up with your Primary Care Provider.  If you are age 69 or younger, your body mass index should be between 19-25. Your Body mass index is 34.15 kg/m. If this is out of the aformentioned range listed, please consider follow up with your Primary Care Provider.   Please use sitz bath (look over paper provided)   Please use Recticare over the counter as needed

## 2020-03-03 DIAGNOSIS — Z01419 Encounter for gynecological examination (general) (routine) without abnormal findings: Secondary | ICD-10-CM | POA: Diagnosis not present

## 2020-03-03 DIAGNOSIS — Z124 Encounter for screening for malignant neoplasm of cervix: Secondary | ICD-10-CM | POA: Diagnosis not present

## 2020-03-03 DIAGNOSIS — N951 Menopausal and female climacteric states: Secondary | ICD-10-CM | POA: Diagnosis not present

## 2020-03-03 DIAGNOSIS — Z1151 Encounter for screening for human papillomavirus (HPV): Secondary | ICD-10-CM | POA: Diagnosis not present

## 2020-03-03 DIAGNOSIS — Z130 Encounter for screening for diseases of the blood and blood-forming organs and certain disorders involving the immune mechanism: Secondary | ICD-10-CM | POA: Diagnosis not present

## 2020-03-04 DIAGNOSIS — Z1151 Encounter for screening for human papillomavirus (HPV): Secondary | ICD-10-CM | POA: Diagnosis not present

## 2020-03-04 DIAGNOSIS — Z124 Encounter for screening for malignant neoplasm of cervix: Secondary | ICD-10-CM | POA: Diagnosis not present

## 2020-03-07 ENCOUNTER — Telehealth: Payer: Self-pay | Admitting: Physician Assistant

## 2020-03-07 NOTE — Telephone Encounter (Signed)
Pt called to schedule new patient with dr Damita Dunnings.  She stated you talked to her sister tammy norman this morning and told her it was ok  Ok to schedule

## 2020-03-08 NOTE — Telephone Encounter (Signed)
Yes, please schedule a new patient appointment for the fall, assuming there is not an urgent issue.  Thanks.

## 2020-03-09 NOTE — Telephone Encounter (Signed)
11/16 appointment

## 2020-03-10 LAB — HM PAP SMEAR: HPV 16/18/45 genotyping: NEGATIVE

## 2020-05-10 ENCOUNTER — Ambulatory Visit: Payer: BC Managed Care – PPO | Admitting: Family Medicine

## 2020-05-10 ENCOUNTER — Other Ambulatory Visit: Payer: Self-pay

## 2020-05-10 ENCOUNTER — Encounter: Payer: Self-pay | Admitting: Family Medicine

## 2020-05-10 VITALS — BP 120/82 | HR 89 | Temp 98.3°F | Ht 68.0 in | Wt 236.8 lb

## 2020-05-10 DIAGNOSIS — Z8632 Personal history of gestational diabetes: Secondary | ICD-10-CM | POA: Diagnosis not present

## 2020-05-10 DIAGNOSIS — Z7185 Encounter for immunization safety counseling: Secondary | ICD-10-CM | POA: Diagnosis not present

## 2020-05-10 DIAGNOSIS — D3500 Benign neoplasm of unspecified adrenal gland: Secondary | ICD-10-CM

## 2020-05-10 DIAGNOSIS — Z7189 Other specified counseling: Secondary | ICD-10-CM

## 2020-05-10 NOTE — Patient Instructions (Signed)
Don't change your meds for now.   Take care.  Glad to see you. Update me as needed.   

## 2020-05-10 NOTE — Progress Notes (Signed)
This visit occurred during the SARS-CoV-2 public health emergency.  Safety protocols were in place, including screening questions prior to the visit, additional usage of staff PPE, and extensive cleaning of exam room while observing appropriate contact time as indicated for disinfecting solutions.  Sister Lynelle Smoke then husband Ulice Dash designated next if patient were incapacitated.    History of adrenal adenoma. She reportedly had urologic work-up previously. Requesting records. She was aware of any residual issue that was a problem at this point, at least related to this. Discussed with patient about adrenal adenoma considerations in general. I will await old records. She is not having abdominal pain or hematuria or other concerns currently.  She has a history of gestational diabetes but none known currently. Diet and exercise discussed with patient Routine vaccination encouraged. She is up-to-date on tetanus. Especially encouraged her to get flu and COVID which she declined at this point. She has no medical reason not to get vaccinated for flu or Covid or any of the other routine age-appropriate vaccines.  Pap smears to gynecology. She is up-to-date on mammogram.  Recent labs discussed with patient  The 10-year ASCVD risk score Mikey Bussing DC Brooke Bonito., et al., 2013) is: 3%   Values used to calculate the score:     Age: 54 years     Sex: Female     Is Non-Hispanic African American: No     Diabetic: No     Tobacco smoker: Yes     Systolic Blood Pressure: 119 mmHg     Is BP treated: No     HDL Cholesterol: 61 mg/dL     Total Cholesterol: 180 mg/dL  Her mother recently required temporary SNF placement while her father had surgery. Her mother is back at home with the father now.  She smokes half pack per day. She is not yet ready to quit smoking. Discussed.  PMH and SH reviewed  ROS: Per HPI unless specifically indicated in ROS section   Meds, vitals, and allergies reviewed.   GEN: nad, alert and  oriented HEENT: ncat NECK: supple w/o LA CV: rrr. PULM: ctab, no inc wob ABD: soft, +bs EXT: no edema SKIN: Well-perfused.  At least 30 minutes were devoted to patient care in this encounter (this can potentially include time spent reviewing the patient's file/history, interviewing and examining the patient, counseling/reviewing plan with patient, ordering referrals, ordering tests, reviewing relevant laboratory or x-ray data, and documenting the encounter).

## 2020-05-15 ENCOUNTER — Encounter: Payer: Self-pay | Admitting: Family Medicine

## 2020-05-15 DIAGNOSIS — Z7185 Encounter for immunization safety counseling: Secondary | ICD-10-CM | POA: Insufficient documentation

## 2020-05-15 DIAGNOSIS — Z7189 Other specified counseling: Secondary | ICD-10-CM | POA: Insufficient documentation

## 2020-05-15 NOTE — Assessment & Plan Note (Signed)
Routine vaccination encouraged. She is up-to-date on tetanus. Especially encouraged her to get flu and COVID which she declined at this point. She has no medical reason not to get vaccinated for flu or Covid or any of the other routine age-appropriate vaccines.

## 2020-05-15 NOTE — Assessment & Plan Note (Signed)
Requesting records from urology.

## 2020-05-15 NOTE — Assessment & Plan Note (Signed)
Most recent A1c not elevated. Discussed with patient.

## 2020-05-15 NOTE — Assessment & Plan Note (Signed)
Sister Lynelle Smoke then husband Ulice Dash designated next if patient were incapacitated.

## 2020-05-16 DIAGNOSIS — Z1231 Encounter for screening mammogram for malignant neoplasm of breast: Secondary | ICD-10-CM | POA: Diagnosis not present

## 2020-08-01 ENCOUNTER — Encounter: Payer: Self-pay | Admitting: Family Medicine

## 2021-01-16 DIAGNOSIS — M79672 Pain in left foot: Secondary | ICD-10-CM | POA: Diagnosis not present

## 2021-01-16 DIAGNOSIS — S93602A Unspecified sprain of left foot, initial encounter: Secondary | ICD-10-CM | POA: Diagnosis not present

## 2021-03-11 DIAGNOSIS — J209 Acute bronchitis, unspecified: Secondary | ICD-10-CM | POA: Diagnosis not present

## 2021-03-11 DIAGNOSIS — R509 Fever, unspecified: Secondary | ICD-10-CM | POA: Diagnosis not present

## 2021-03-11 DIAGNOSIS — J069 Acute upper respiratory infection, unspecified: Secondary | ICD-10-CM | POA: Diagnosis not present

## 2021-03-13 ENCOUNTER — Telehealth: Payer: Self-pay

## 2021-03-13 ENCOUNTER — Encounter (HOSPITAL_COMMUNITY): Payer: Self-pay

## 2021-03-13 ENCOUNTER — Ambulatory Visit (INDEPENDENT_AMBULATORY_CARE_PROVIDER_SITE_OTHER): Payer: BC Managed Care – PPO

## 2021-03-13 ENCOUNTER — Ambulatory Visit (HOSPITAL_COMMUNITY): Payer: BC Managed Care – PPO

## 2021-03-13 ENCOUNTER — Other Ambulatory Visit: Payer: Self-pay

## 2021-03-13 ENCOUNTER — Ambulatory Visit (HOSPITAL_COMMUNITY)
Admission: RE | Admit: 2021-03-13 | Discharge: 2021-03-13 | Disposition: A | Discharge status: Home / Self Care | Payer: BC Managed Care – PPO | Source: Ambulatory Visit | Attending: Student | Admitting: Student

## 2021-03-13 VITALS — BP 117/74 | HR 84 | Temp 98.2°F | Resp 20

## 2021-03-13 DIAGNOSIS — J209 Acute bronchitis, unspecified: Secondary | ICD-10-CM

## 2021-03-13 DIAGNOSIS — R0602 Shortness of breath: Secondary | ICD-10-CM | POA: Diagnosis not present

## 2021-03-13 NOTE — Telephone Encounter (Signed)
Per UC note- Advised patient if symptoms not improving in the next 3 to 5 days then follow-up.  If not better, then needs recheck.   If worse/SOB, then ER.  Thanks.

## 2021-03-13 NOTE — Telephone Encounter (Signed)
Noted. Thanks.

## 2021-03-13 NOTE — Telephone Encounter (Signed)
I was unable to reach pt by phone; I left v/m at home # for pt to call [facility]. I spoke with pts sister Tammy and she said she spoke with pt about an hour ago and pt was doing OK and had not gone to UC or ED. Tammy said pt is probably asleep. Tammy will let pt know to call [facility] if she talks with her later today. Sending note to [facility] triage,Dr Damita Dunnings and Janett Billow CMA. Also sending notes to [place] in teams.

## 2021-03-13 NOTE — Telephone Encounter (Addendum)
Pt returned call; pt notified as instructed by Dr Josefine Class instructions below;pt does not have way to go unless she drives herself. Pt said she is better somewhat than yesterday. Pt has not had fever since 03/13/21; pt said taking tylenol q 4 h. Pt said she is SOB upon any exertion even walking and pt has some dizziness also. Pt said earlier she was offered a video appt but did not want that due to wanting to see a doctor and have someone listen to her chest and see if she needed a CXR for fear of pneumonia. I scheduled pt an appt at [facility] UC on [address] in [place] on 03/13/21 at 7 PM. Pt said her husband would probably home by then to take pt to UC.pt will cb with update later this wk. UC & ED parecautions given and pt voiced understanding. Sending note to Dr Damita Dunnings and Janett Billow CMA; sending teams to [facility] also. Pt said she was neg for covid.

## 2021-03-13 NOTE — Telephone Encounter (Signed)
[facility] - Client TELEPHONE ADVICE RECORD [facility] Patient Name: Bethany Johns Gender: Female DOB: [birth year] Age: 55 Y 27 M 22 D Return Phone Number: [phone] (Primary) Address: City/ State/ Zip: [place] [address] Client [facility] - Client Client Site [facility] - Day Physician Renford Dills - MD Contact Type Call Who Is Calling Patient / Member / Family / Caregiver Call Type Triage / Clinical Relationship To Patient Self Return Phone Number [phone] (Primary) Chief Complaint BREATHING - shortness of breath or sounds breathless Reason for Call Symptomatic / Request for [place] wants to make an appointment. Patient has bronchitis. She is dizzy and gets out of breath when walking Translation No Nurse Assessment Nurse: Fredderick Phenix, RN, Lelan Pons Date/Time Eilene Ghazi Time): 03/13/2021 9:44:19 AM Confirm and document reason for call. If symptomatic, describe symptoms. ---Caller was seen in Urgent Care for shortness of breath. Taking prednisone, albuterol, and its not helping. Patient has bronchitis. She is dizzy and gets out of breath when walking. Afebrile. Does the patient have any new or worsening symptoms? ---Yes Will a triage be completed? ---Yes Related visit to physician within the last 2 weeks? ---Yes Does the PT have any chronic conditions? (i.e. diabetes, asthma, this includes High risk factors for pregnancy, etc.) ---No Is the patient pregnant or possibly pregnant? (Ask all females between the ages of 62-55) ---No Is this a behavioral health or substance abuse call? ---No Guidelines Guideline Title Affirmed Question Affirmed Notes Nurse Date/Time (Eastern Time) Breathing Difficulty Wheezing can be heard across the room [place], RN, Lelan Pons 03/13/2021 9:47:40 AM Disp. Time Eilene Ghazi Time) Disposition Final User 03/13/2021 9:40:25 AM Send to  Urgent Queue Jaclyn Prime 03/13/2021 9:51:38 AM Go to ED Now Yes Fredderick Phenix, RN, [name] NOTE: All timestamps contained within this report are represented as [place] Standard Time. CONFIDENTIALTY NOTICE: This fax transmission is intended only for the addressee. It contains information that is legally privileged, confidential or otherwise protected from use or disclosure. If you are not the intended recipient, you are strictly prohibited from reviewing, disclosing, copying using or disseminating any of this information or taking any action in reliance on or regarding this information. If you have received this fax in error, please notify us immediately by telephone so that we can arrange for its return to [place]. Phone: [phone], Toll-Free: [phone], Fax: [phone] Page: 2 of 2 Call Id: [id number] [place] Disagree/Comply Comply Caller Understands Yes PreDisposition Did not know what to do Care Advice Given Per Guideline GO TO ED NOW: * You need to be seen in the Emergency Department. * Leave now. Drive carefully. NOTE TO TRIAGER - DRIVING: * Another adult should drive. CARE ADVICE given per Breathing Difficulty (Adult) guideline. Referrals [facility] - ED

## 2021-03-13 NOTE — ED Provider Notes (Signed)
[facility]    CSN: [id number] Arrival date & time: 03/13/21  1848      History   Chief Complaint Chief Complaint  Patient presents with   Appointment    1900   Shortness of Breath   Dizziness   Wheezing    HPI Bethany Johns is a 55 y.o. female presenting with wheezing shortness of breath and lightheadedness x3 days.  Distant history of pneumonia. Was seen at urgent care 9/17 and treated for bronchitis, symptoms slowly improving.  States she spoke with her primary care who advised her to come here.  Exposure to RSV from granddaughter.  Negative COVID test at urgent care 9/17.  Patient states that she is coughing and wheezing, shortness of breath during coughing episodes and with exertion.  Some lightheadedness with coughing episodes and with going from sitting to standing, this resolves with sitting.  Denies chest pain, weakness, dizziness at rest, headaches, vision changes, left arm pain.  HPI  Past Medical History:  Diagnosis Date   Anemia    Arthritis    both knees   Diabetes mellitus without complication ([facility])    gestational    Fibromyalgia    Headache    Pneumonia    as a child   Smoker    Tubular adenoma of colon    Vaginal delivery 1998    Patient Active Problem List   Diagnosis Date Noted   Vaccine counseling 05/15/2020   Advance care planning 05/15/2020   Snoring 12/09/2018   Deviated septum 12/04/2018   BMI 38.0-38.9,adult 12/04/2018   Healthcare maintenance 09/24/2018   Personal history of gestational diabetes 09/24/2018   Adrenal adenoma-adrenal hyperplasia with mild nodularity 08/22/2018   Gastroesophageal reflux disease 10/22/2017   Primary osteoarthritis of both feet 03/18/2017   Degeneration of lumbar intervertebral disc 03/18/2017   Plantar fasciitis, bilateral 02/11/2017   Primary osteoarthritis of both knees 02/11/2017    Past Surgical History:  Procedure Laterality Date   ANKLE ARTHROPLASTY     CHOLECYSTECTOMY N/A 06/26/2013    Procedure: LAPAROSCOPIC CHOLECYSTECTOMY WITH INTRAOPERATIVE CHOLANGIOGRAM, POSSIBLE OPEN;  Surgeon: [name], MD;  Location: [facility];  Service: General;  Laterality: N/A;   [place] ARTHROSCOPY Right 08/2012   LAPAROSCOPIC SALPINGO OOPHERECTOMY Left 06/15/2015   Procedure: LAPAROSCOPIC LEFT SALPINGO OOPHORECTOMY;  Surgeon: Paula Compton, MD;  Location: [facility];  Service: Gynecology;  Laterality: Left;   LASIK     eye surgery   NASAL SEPTOPLASTY W/ TURBINOPLASTY Bilateral 05/29/2019   Procedure: NASAL SEPTOPLASTY WITH TURBINATE REDUCTION;  Surgeon: Jerrell Belfast, MD;  Location: [facility];  Service: ENT;  Laterality: Bilateral;   NOVASURE ABLATION N/A 06/15/2015   Procedure: Rebbeca Paul ABLATION;  Surgeon: Paula Compton, MD;  Location: [facility];  Service: Gynecology;  Laterality: N/A;   OOPHORECTOMY      OB History   No obstetric history on file.      Home Medications    Prior to Admission medications   Medication Sig Start Date End Date Taking? Authorizing Provider  acetaminophen (TYLENOL) 500 MG tablet Take 500-1,000 mg by mouth every 6 (six) hours as needed (for pain/headaches.).    [provider]  albuterol (VENTOLIN HFA) 108 (90 Base) MCG/ACT inhaler Inhale 2 puffs into the lungs every 6 (six) hours as needed. 03/11/21   [provider]  aspirin-acetaminophen-caffeine (EXCEDRIN MIGRAINE) [phone] MG tablet Take 2-3 tablets by mouth every 6 (  six) hours as needed for headache.    [provider]  brompheniramine-pseudoephedrine-DM 30-2-10 MG/5ML syrup Take 5 mLs by mouth 3 (three) times daily. 03/11/21   [provider]  cetirizine (ZYRTEC) 10 MG tablet Take 10 mg by mouth at bedtime. 03/11/21   [provider]  meloxicam (MOBIC) 15 MG tablet Take 15 mg by mouth daily. 11/26/19   [provider]  predniSONE (STERAPRED UNI-PAK 21 TAB) 10 MG (21) TBPK tablet Take by mouth as  directed. 03/11/21   [provider]    Family History Family History  Problem Relation Age of Onset   Breast cancer Maternal Grandmother 48   Dementia Mother    Hypertension Father    Bladder Cancer Father    Healthy Daughter    Aneurysm Paternal Grandfather    Colon cancer Neg Hx    Liver disease Neg Hx    Inflammatory bowel disease Neg Hx     Social History Social History   Tobacco Use   Smoking status: Every Day    Packs/day: 0.50    Years: 5.00    Pack years: 2.50    Types: Cigarettes   Smokeless tobacco: Never   Tobacco comments:    SOCIALLY ONLY  Vaping Use   Vaping Use: Never used  Substance Use Topics   Alcohol use: Yes    Comment: occasional beer   Drug use: Never     Allergies   Tramadol, Amoxicillin, and Penicillins   Review of Systems Review of Systems  Constitutional:  Negative for appetite change, chills and fever.  HENT:  Positive for congestion. Negative for ear pain, rhinorrhea, sinus pressure, sinus pain and sore throat.   Eyes:  Negative for redness and visual disturbance.  Respiratory:  Positive for cough, shortness of breath and wheezing. Negative for chest tightness.   Cardiovascular:  Negative for chest pain and palpitations.  Gastrointestinal:  Negative for abdominal pain, constipation, diarrhea, nausea and vomiting.  Genitourinary:  Negative for dysuria, frequency and urgency.  Musculoskeletal:  Negative for myalgias.  Neurological:  Negative for dizziness, weakness and headaches.  Psychiatric/Behavioral:  Negative for confusion.   All other systems reviewed and are negative.   Physical Exam Triage Vital Signs ED Triage Vitals [03/13/21 1932]  Enc Vitals Group     BP 117/74     Pulse Rate 84     Resp 20     Temp 98.2 F (36.8 C)     Temp Source Oral     SpO2 97 %     Weight      Height      Head Circumference      Peak Flow      Pain Score      Pain Loc      Pain Edu?      Excl. in [place]?    No data  found.  Updated Vital Signs BP 117/74   Pulse 84   Temp 98.2 F (36.8 C) (Oral)   Resp 20   SpO2 97%   Visual Acuity Right Eye Distance:   Left Eye Distance:   Bilateral Distance:    Right Eye Near:   Left Eye Near:    Bilateral Near:     Physical Exam Vitals reviewed.  Constitutional:      General: She is not in acute distress.    Appearance: Normal appearance. She is not ill-appearing.  HENT:     Head: Normocephalic and atraumatic.     Right Ear:  Tympanic membrane, ear canal and external ear normal. No tenderness. No middle ear effusion. There is no impacted cerumen. Tympanic membrane is not perforated, erythematous, retracted or bulging.     Left Ear: Tympanic membrane, ear canal and external ear normal. No tenderness.  No middle ear effusion. There is no impacted cerumen. Tympanic membrane is not perforated, erythematous, retracted or bulging.     Nose: Nose normal. No congestion.     Mouth/Throat:     Mouth: Mucous membranes are moist.     Pharynx: Uvula midline. No oropharyngeal exudate or posterior oropharyngeal erythema.  Eyes:     Extraocular Movements: Extraocular movements intact.     Pupils: Pupils are equal, round, and reactive to light.  Cardiovascular:     Rate and Rhythm: Normal rate and regular rhythm.     Heart sounds: Normal heart sounds.  Pulmonary:     Effort: Pulmonary effort is normal.     Breath sounds: Wheezing present. No decreased breath sounds, rhonchi or rales.     Comments: Wheezing throughout  Oxygenating comfortably Abdominal:     Palpations: Abdomen is soft.     Tenderness: There is no abdominal tenderness. There is no guarding or rebound.  Neurological:     General: No focal deficit present.     Mental Status: She is alert and oriented to person, place, and time.     Comments: CN 2-12 grossly intact. PERRLA, EOMI. Strength and sensation intact upper and lower extremities.  Psychiatric:        Mood and Affect: Mood normal.         Behavior: Behavior normal.        Thought Content: Thought content normal.        Judgment: Judgment normal.     UC Treatments / Results  Labs (all labs ordered are listed, but only abnormal results are displayed) Labs Reviewed - No data to display  EKG   Radiology DG Chest 2 View  Result Date: 03/13/2021 CLINICAL DATA:  Shortness of breath for several days EXAM: CHEST - 2 VIEW COMPARISON:  None. FINDINGS: The heart size and mediastinal contours are within normal limits. Both lungs are clear. The visualized skeletal structures are unremarkable. IMPRESSION: No active cardiopulmonary disease. Electronically Signed   By: Inez Catalina M.D.   On: 03/13/2021 19:45    Procedures Procedures (including critical care time)  Medications Ordered in UC Medications - No data to display  Initial Impression / Assessment and Plan / UC Course  I have reviewed the triage vital signs and the nursing notes.  Pertinent labs & imaging results that were available during my care of the patient were reviewed by me and considered in my medical decision making (see chart for details).     This patient is a very pleasant 55 y.o. year old female presenting with acute bronchitis. Today this pt is afebrile nontachycardic nontachypneic, oxygenating well on room air, wheezes throughout but no rhonchi or rales.   She is already taking prednisone, albuterol, brompheniramine-pseudoephedrine-DM 30-2-10 MG/5ML syrup, and zyrtec with some improvement. No history cardiopulm ds. Covid negative 9/17. CXR- No active cardiopulmonary disease.  Continue current regimen.  ED return precautions discussed. Patient verbalizes understanding and agreement.   Coding Level 4 for acute illness with systemic symptoms, review of past notes and labs, ordering interpretation of labs today.  Final Clinical Impressions(s) / UC Diagnoses   Final diagnoses:  Acute bronchitis, unspecified organism     Discharge Instructions       -  Continue cough syrup, albuterol, prednisone, zyrtec -Follow-up with [place] or PCP in 3 days if symptoms persist, or sooner if they worsen     ED Prescriptions   None    PDMP not reviewed this encounter.   Hazel Sams, PA-C 03/13/21 2013

## 2021-03-13 NOTE — Discharge Instructions (Addendum)
-  Continue cough syrup, albuterol, prednisone, zyrtec -Follow-up with [place] or PCP in 3 days if symptoms persist, or sooner if they worsen

## 2021-03-13 NOTE — ED Triage Notes (Signed)
Pt presents with nasal congestion, cough, wheezing and dizziness when standing up x 2-3 days. States the shortness of breath in worse since yesterday.   Pt reports she is being treat it for bronchitis.   Pt reports she was taking care of her granddaughter who had RSV.

## 2021-05-10 IMAGING — US US BREAST*R* LIMITED INC AXILLA
1 series · 1 of 1 positions shown · non-contrast
Comparison: Previous exam(s).

CLINICAL DATA: Follow-up of a probable benign asymmetry identified
on the screening mammogram from Friday December, 2016.

EXAM:
DIGITAL DIAGNOSTIC BILATERAL MAMMOGRAM WITH CAD AND TOMO
ULTRASOUND RIGHT BREAST

[Series 1: us breast*right* limited inc axilla · 0.07mm/px · 1 of 1 slices shown]
[im 1/1]
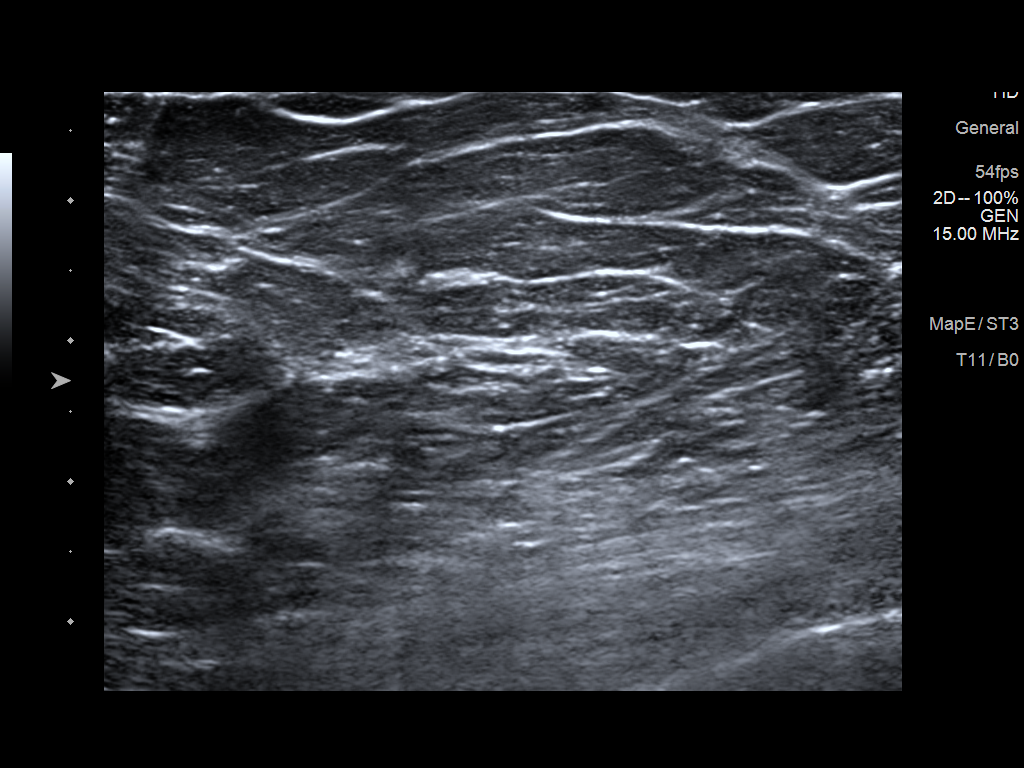

[1 of 1 positions shown; findings below may reference images not displayed]

ACR Breast Density Category b: There are scattered areas of
fibroglandular density.
FINDINGS: No suspicious mass, malignant type microcalcifications or distortion
detected in either breast. The previously described asymmetry in the
medial aspect of the right breast is less conspicuous on today's
images.

Mammographic images were processed with CAD.

Targeted ultrasound is performed, showing normal tissue in the right
breast at [DATE] 2 cm from the nipple. The previously seen complex
cyst is no longer visualized.
IMPRESSION: No evidence of malignancy in either breast.

RECOMMENDATION:
Bilateral screening mammogram in 1 year is recommended.

I have discussed the findings and recommendations with the patient.
If applicable, a reminder letter will be sent to the patient
regarding the next appointment.

BI-RADS CATEGORY  1: Negative.

## 2021-05-29 ENCOUNTER — Telehealth: Payer: Self-pay

## 2021-05-29 ENCOUNTER — Ambulatory Visit: Payer: BC Managed Care – PPO | Admitting: Nurse Practitioner

## 2021-05-29 ENCOUNTER — Other Ambulatory Visit: Payer: Self-pay

## 2021-05-29 VITALS — BP 118/76 | HR 94 | Temp 97.5°F | Resp 16 | Ht 68.0 in | Wt 246.1 lb

## 2021-05-29 DIAGNOSIS — H6692 Otitis media, unspecified, left ear: Secondary | ICD-10-CM | POA: Diagnosis not present

## 2021-05-29 DIAGNOSIS — G4489 Other headache syndrome: Secondary | ICD-10-CM

## 2021-05-29 DIAGNOSIS — U071 COVID-19: Secondary | ICD-10-CM | POA: Insufficient documentation

## 2021-05-29 LAB — POC COVID19 BINAXNOW: SARS Coronavirus 2 Ag: POSITIVE — AB

## 2021-05-29 MED ORDER — AZITHROMYCIN 250 MG PO TABS: ORAL_TABLET | ORAL | 0 refills | Status: AC

## 2021-05-29 NOTE — Progress Notes (Signed)
Acute Office Visit  Subjective:    Patient ID: Bethany Johns, female    DOB: 09/25/1965, 55 y.o.   MRN: [id number]  Chief Complaint  Patient presents with   Headache    Sx started on 05/28/21-Sinus pressure/pain, left earache and stopped up, sore in the neck glands, stuffy nose, some post nasal drip, fatigue. Did not test for Covid.     Patient is in today for Headache  States symptoms started last night all of sudden. Felt a chill and malaise.   States that she had a headache, took tylenol and ibuprofen. Did not help  No Covid vaccines and no sick contacts. States that her husband did throw up once and then had a couple of episodes of  diarrhea but felt that was due to food that was greasy  Put a brace in her mouth for a dental implant and has been taking OTC analgesics for a anticipated dental implant   Past Medical History:  Diagnosis Date   Anemia    Arthritis    both knees   Diabetes mellitus without complication ([place])    gestational    Fibromyalgia    Headache    Pneumonia    as a child   Smoker    Tubular adenoma of colon    Vaginal delivery 1998    Past Surgical History:  Procedure Laterality Date   ANKLE ARTHROPLASTY     CHOLECYSTECTOMY N/A 06/26/2013   Procedure: LAPAROSCOPIC CHOLECYSTECTOMY WITH INTRAOPERATIVE CHOLANGIOGRAM, POSSIBLE OPEN;  Surgeon: [name], MD;  Location: [facility];  Service: General;  Laterality: N/A;   EYE SURGERY     FOOT SURGERY     PLANTAR FASCIITIS   KNEE ARTHROSCOPY Right 08/2012   LAPAROSCOPIC SALPINGO OOPHERECTOMY Left 06/15/2015   Procedure: LAPAROSCOPIC LEFT SALPINGO OOPHORECTOMY;  Surgeon: Paula Compton, MD;  Location: [facility];  Service: Gynecology;  Laterality: Left;   LASIK     eye surgery   NASAL SEPTOPLASTY W/ TURBINOPLASTY Bilateral 05/29/2019   Procedure: NASAL SEPTOPLASTY WITH TURBINATE REDUCTION;  Surgeon: Jerrell Belfast, MD;  Location: [facility];  Service: ENT;  Laterality: Bilateral;   NOVASURE ABLATION N/A  06/15/2015   Procedure: Rebbeca Paul ABLATION;  Surgeon: Paula Compton, MD;  Location: [facility];  Service: Gynecology;  Laterality: N/A;   OOPHORECTOMY      Family History  Problem Relation Age of Onset   Breast cancer Maternal Grandmother 59   Dementia Mother    Hypertension Father    Bladder Cancer Father    Healthy Daughter    Aneurysm Paternal Grandfather    Colon cancer Neg Hx    Liver disease Neg Hx    Inflammatory bowel disease Neg Hx     Social History   Socioeconomic History   Marital status: Married    Spouse name: Not on file   Number of children: Not on file   Years of education: Not on file   Highest education level: Not on file  Occupational History   Not on file  Tobacco Use   Smoking status: Every Day    Packs/day: 0.50    Years: 5.00    Pack years: 2.50    Types: Cigarettes   Smokeless tobacco: Never   Tobacco comments:    SOCIALLY ONLY  Vaping Use   Vaping Use: Never used  Substance and Sexual Activity   Alcohol use: Yes    Comment: occasional beer   Drug use: Never   Sexual activity: Yes  Birth control/protection: None    Comment: ablation  Other Topics Concern   Not on file  Social History Narrative   Remarried 2017.   Works at [employer].   Smokes approximately half pack per day as of 2021   Social Determinants of Health   Financial Resource Strain: Not on file  Food Insecurity: Not on file  Transportation Needs: Not on file  Physical Activity: Not on file  Stress: Not on file  Social Connections: Not on file  Intimate Partner Violence: Not on file    Outpatient Medications Prior to Visit  Medication Sig Dispense Refill   acetaminophen (TYLENOL) 500 MG tablet Take 500-1,000 mg by mouth every 6 (six) hours as needed (for pain/headaches.).     albuterol (VENTOLIN HFA) 108 (90 Base) MCG/ACT inhaler Inhale 2 puffs into the lungs every 6 (six) hours as needed.     aspirin-acetaminophen-caffeine (EXCEDRIN MIGRAINE)  250-250-65 MG tablet Take 2-3 tablets by mouth every 6 (six) hours as needed for headache.     cetirizine (ZYRTEC) 10 MG tablet Take 10 mg by mouth at bedtime.     fluticasone (VERAMYST) 27.5 MCG/SPRAY nasal spray Place 2 sprays into the nose daily.     meloxicam (MOBIC) 15 MG tablet Take 15 mg by mouth daily.     brompheniramine-pseudoephedrine-DM 30-2-10 MG/5ML syrup Take 5 mLs by mouth 3 (three) times daily.     predniSONE (STERAPRED UNI-PAK 21 TAB) 10 MG (21) TBPK tablet Take by mouth as directed.     No facility-administered medications prior to visit.    Allergies  Allergen Reactions   Tramadol Itching   Amoxicillin Rash    Has patient had a PCN reaction causing immediate rash, facial/tongue/throat swelling, SOB or lightheadedness with hypotension: No Has patient had a PCN reaction causing severe rash involving mucus membranes or skin necrosis: No Has patient had a PCN reaction that required hospitalization No Has patient had a PCN reaction occurring within the last 10 years: No If all of the above answers are "NO", then may proceed with Cephalosporin use.    Penicillins Rash    Has patient had a PCN reaction causing immediate rash, facial/tongue/throat swelling, SOB or lightheadedness with hypotension: No Has patient had a PCN reaction causing severe rash involving mucus membranes or skin necrosis: No Has patient had a PCN reaction that required hospitalization No Has patient had a PCN reaction occurring within the last 10 years: No If all of the above answers are "NO", then may proceed with Cephalosporin use.   Other reaction(s): Rash Has patient had a PCN reaction causing immediate rash, facial/tongue/throat swelling, SOB or lightheadedness with hypotension: No Has patient had a PCN reaction causing severe rash involving mucus membranes or skin necrosis: No Has patient had a PCN reaction that required hospitalization No Has patient had a PCN reaction occurring within the last  10 years: No If all of the above answers are "NO", then may proceed with Cephalosporin use. Has patient had a PCN reaction causing immediate rash, facial/tongue/throat swelling, SOB or lightheadedness with hypotension: No Has patient had a PCN reaction causing severe rash involving mucus membranes or skin necrosis: No Has patient had a PCN reaction that required hospitalization No Has patient had a PCN reaction occurring within the last 10 years: No If all of the above answers are "NO", then may proceed with Cephalosporin use. Has patient had a PCN reaction causing immediate rash, facial/tongue/throat swelling, SOB or lightheadedness with hypotension: No Has patient had  a PCN reaction causing severe rash involving mucus membranes or skin necrosis: No Has patient had a PCN reaction that required hospitalization No Has patient had a PCN reaction occurring within the last 10 years: No If all of the above answers are "NO", then may proceed with Cephalosporin use.    Review of Systems  Constitutional:  Positive for chills and fatigue. Negative for fever.  HENT:  Positive for congestion, ear pain (fullness and pain  left ear), postnasal drip, sinus pressure and sinus pain. Negative for sore throat.   Respiratory:  Positive for cough. Negative for shortness of breath.   Cardiovascular:  Negative for chest pain.  Gastrointestinal:  Negative for abdominal pain, diarrhea, nausea and vomiting.  Musculoskeletal:  Negative for arthralgias and neck pain.  Neurological:  Positive for headaches.      Objective:    Physical Exam Vitals and nursing note reviewed.  Constitutional:      Appearance: Normal appearance.  HENT:     Right Ear: Tympanic membrane, ear canal and external ear normal. There is no impacted cerumen.     Left Ear: Ear canal and external ear normal. There is no impacted cerumen. Tympanic membrane is erythematous and bulging.     Mouth/Throat:     Mouth: Mucous membranes are moist.      Pharynx: Oropharynx is clear.     Tonsils: No tonsillar exudate or tonsillar abscesses. 3+ on the right. 2+ on the left.     Comments: Large tonsils per baseline according to patient  Eyes:     Extraocular Movements: Extraocular movements intact.  Cardiovascular:     Rate and Rhythm: Normal rate and regular rhythm.  Pulmonary:     Effort: Pulmonary effort is normal.     Breath sounds: Normal breath sounds.  Abdominal:     General: Bowel sounds are normal.  Musculoskeletal:       Arms:  Skin:    General: Skin is warm.  Neurological:     Mental Status: She is alert.  Psychiatric:        Mood and Affect: Mood normal.        Behavior: Behavior normal.        Thought Content: Thought content normal.        Judgment: Judgment normal.    BP 118/76   Pulse 94   Temp (!) 97.5 F (36.4 C)   Resp 16   Ht 5\' 8"  (1.727 m)   Wt 246 lb 2 oz (111.6 kg)   SpO2 96%   BMI 37.42 kg/m  Wt Readings from Last 3 Encounters:  05/29/21 246 lb 2 oz (111.6 kg)  05/10/20 236 lb 12.8 oz (107.4 kg)  03/02/20 231 lb 4 oz (104.9 kg)    Health Maintenance Due  Topic Date Due   COVID-19 Vaccine (1) Never done   Pneumococcal Vaccine 19-26 Years old (1 - PCV) Never done   HIV Screening  Never done   Zoster Vaccines- Shingrix (1 of 2) Never done   PAP SMEAR-Modifier  12/26/2019   MAMMOGRAM  05/12/2021    There are no preventive care reminders to display for this patient.   Lab Results  Component Value Date   TSH 2.430 11/26/2019   Lab Results  Component Value Date   WBC 5.8 11/26/2019   HGB 12.3 11/26/2019   HCT 36.2 11/26/2019   MCV 95 11/26/2019   PLT 316 11/26/2019   Lab Results  Component Value Date   NA 142 11/26/2019  K 4.3 11/26/2019   CO2 23 11/26/2019   GLUCOSE 95 11/26/2019   BUN 18 11/26/2019   CREATININE 0.77 11/26/2019   BILITOT 0.3 11/26/2019   ALKPHOS 83 11/26/2019   AST 19 11/26/2019   ALT 21 11/26/2019   PROT 6.7 11/26/2019   ALBUMIN 4.5 11/26/2019    CALCIUM 9.3 11/26/2019   ANIONGAP 7 06/13/2015   Lab Results  Component Value Date   CHOL 180 11/26/2019   Lab Results  Component Value Date   HDL 61 11/26/2019   Lab Results  Component Value Date   LDLCALC 98 11/26/2019   Lab Results  Component Value Date   TRIG 117 11/26/2019   Lab Results  Component Value Date   CHOLHDL 3.0 11/26/2019   Lab Results  Component Value Date   HGBA1C 5.2 11/26/2019       Assessment & Plan:   Problem List Items Addressed This Visit       Nervous and Auditory   Left otitis media    Patient has left otitis media.  Has allergies to penicillin amoxicillin we will treat with azithromycin as she has tolerated that medication well in the past.      Relevant Medications   azithromycin (ZITHROMAX) 250 MG tablet     Other   Other headache syndrome - Primary    Continue using over-the-counter analgesics as needed for symptom relief.      Relevant Orders   POC COVID-19 (Completed)   COVID-19    COVID-19 in office today.  Symptoms started yesterday.  Did have it in length detailed discussion about antiviral treatment.  Did discuss both treatment options and that they are EUA only.  Patient is hesitant.  She has not been vaccinated but has had COVID 2 times before and done fine.  Did discuss with her her risk factors for progressing to severe disease and the purpose of the medications.  She says she would like to think about it I did give detailed information on AVS in regards to both antiviral medications and timeframe as to when we have to start if are going to.  Also reviewed signs and symptoms when to seek urgent or emergent health care.  Continue to monitor she will reach out if she wants to start antiviral therapy      Relevant Medications   azithromycin (ZITHROMAX) 250 MG tablet     No orders of the defined types were placed in this encounter.  This visit occurred during the SARS-CoV-2 public health emergency.  Safety protocols were  in place, including screening questions prior to the visit, additional usage of staff PPE, and extensive cleaning of exam room while observing appropriate contact time as indicated for disinfecting solutions.   Romilda Garret, NP

## 2021-05-29 NOTE — Assessment & Plan Note (Signed)
Patient has left otitis media.  Has allergies to penicillin amoxicillin we will treat with azithromycin as she has tolerated that medication well in the past.

## 2021-05-29 NOTE — Telephone Encounter (Signed)
[facility] - Client TELEPHONE ADVICE RECORD [facility] Patient Name: Bethany Johns Gender: Female DOB: [birth year] Age: 55 Y 52 M 7 D Return Phone Number: [phone] (Primary) Address: City/ State/ Zip: [place] [address] Client [facility] - Client Client Site [place] - Day Provider Renford Dills - MD Contact Type Call Who Is Calling Patient / Member / Family / Caregiver Call Type Triage / Clinical Relationship To Patient Self Return Phone Number [phone] (Primary) Chief Complaint Headache Reason for Call Symptomatic / Request for [place] has an earache a headache and she gets dizzy when she stands up has nasal congestion as well Translation No Nurse Assessment Nurse: Ysidro Evert, RN, Levada Dy Date/Time (Eastern Time): 05/29/2021 8:45:21 AM Confirm and document reason for call. If symptomatic, describe symptoms. ---Caller states she has an earache with a headache and some dizziness. She has sinus congestion and pressure. No fever Does the patient have any new or worsening symptoms? ---Yes Will a triage be completed? ---Yes Related visit to physician within the last 2 weeks? ---No Does the PT have any chronic conditions? (i.e. diabetes, asthma, this includes High risk factors for pregnancy, etc.) ---No Is the patient pregnant or possibly pregnant? (Ask all females between the ages of 18-55) ---No Is this a behavioral health or substance abuse call? ---No Guidelines Guideline Title Affirmed Question Affirmed Notes Nurse Date/Time Eilene Ghazi Time) Sinus Pain or Congestion Larene Pickett, RN, Levada Dy 05/29/2021 8:47:07 AM Disp. Time Eilene Ghazi Time) Disposition Final User 05/29/2021 8:50:12 AM See PCP within 24 Hours Yes Ysidro Evert, RN, Levada Dy PLEASE NOTE: All timestamps contained within this report are represented as [place] Standard Time. CONFIDENTIALTY NOTICE:  This fax transmission is intended only for the addressee. It contains information that is legally privileged, confidential or otherwise protected from use or disclosure. If you are not the intended recipient, you are strictly prohibited from reviewing, disclosing, copying using or disseminating any of this information or taking any action in reliance on or regarding this information. If you have received this fax in error, please notify us immediately by telephone so that we can arrange for its return to [place]. Phone: [phone], Toll-Free: [phone], Fax: [phone] Page: 2 of 2 Call Id: [id number] [place] Disagree/Comply Comply Caller Understands Yes PreDisposition Did not know what to do Care Advice Given Per Guideline SEE PCP WITHIN 24 HOURS: * IF OFFICE WILL BE OPEN: You need to be examined within the next 24 hours. Call your doctor (or NP/PA) when the office opens and make an appointment. NASAL WASHES FOR A STUFFY NOSE: * Introduction: Saline (salt water) nasal irrigation (nasal wash) is an effective and simple home remedy for treating stuffy nose and sinus congestion. The nose can be irrigated by pouring, spraying, or squirting salt water into the nose and then letting it run back out. * How it Helps: The salt water rinses out excess mucus and washes out any irritants (dust, allergens) that might be present. It also moistens the nasal cavity. NASAL WASHES - STEP-BY-STEP INSTRUCTIONS: * STEP 1: Lean over a sink. * STEP 2: Gently squirt or spray warm salt water into one of your nostrils. * STEP 3: Some of the water may run into the back of your throat. Spit this out. If you swallow the salt water it will not hurt you. * STEP 4: Blow your nose to clean out the water and mucus. CALL BACK IF: * Difficulty breathing (and not relieved  by cleaning out nose) * You become worse CARE ADVICE given per Sinus Pain or Congestion (Adult) guideline. Referrals REFERRED TO PCP OFFIC

## 2021-05-29 NOTE — Telephone Encounter (Signed)
Per appt notes pt already has appt scheduled 05/29/21 at 11:40 with Romilda Garret NP. Sending note to Romilda Garret NP and Anastasiya CMA.

## 2021-05-29 NOTE — Assessment & Plan Note (Signed)
COVID-19 in office today.  Symptoms started yesterday.  Did have it in length detailed discussion about antiviral treatment.  Did discuss both treatment options and that they are EUA only.  Patient is hesitant.  She has not been vaccinated but has had COVID 2 times before and done fine.  Did discuss with her her risk factors for progressing to severe disease and the purpose of the medications.  She says she would like to think about it I did give detailed information on AVS in regards to both antiviral medications and timeframe as to when we have to start if are going to.  Also reviewed signs and symptoms when to seek urgent or emergent health care.  Continue to monitor she will reach out if she wants to start antiviral therapy

## 2021-05-29 NOTE — Assessment & Plan Note (Signed)
Continue using over-the-counter analgesics as needed for symptom relief.

## 2021-05-29 NOTE — Patient Instructions (Signed)
Nice to see you today I will send in a Z-pak for the ear infection Let me know about the antiviral there names are Paxlovid and Molnupiravir Follow up if symptoms fail to improve or worsen

## 2022-03-27 ENCOUNTER — Ambulatory Visit: Payer: BC Managed Care – PPO | Admitting: Family Medicine

## 2022-03-27 ENCOUNTER — Encounter: Payer: Self-pay | Admitting: Family Medicine

## 2022-03-27 VITALS — BP 120/82 | HR 96 | Temp 98.0°F | Ht 68.0 in | Wt 253.0 lb

## 2022-03-27 DIAGNOSIS — R051 Acute cough: Secondary | ICD-10-CM

## 2022-03-27 DIAGNOSIS — R059 Cough, unspecified: Secondary | ICD-10-CM | POA: Insufficient documentation

## 2022-03-27 LAB — POC COVID19 BINAXNOW: SARS Coronavirus 2 Ag: NEGATIVE

## 2022-03-27 MED ORDER — ALBUTEROL SULFATE HFA 108 (90 BASE) MCG/ACT IN AERS: 1.0000 | INHALATION_SPRAY | Freq: Four times a day (QID) | RESPIRATORY_TRACT | 1 refills | Status: DC | PRN

## 2022-03-27 MED ORDER — DOXYCYCLINE HYCLATE 100 MG PO TABS: 100.0000 mg | ORAL_TABLET | Freq: Two times a day (BID) | ORAL | 0 refills | Status: DC

## 2022-03-27 NOTE — Assessment & Plan Note (Signed)
covid neg. D/w pt at OV.  Presumed bronchitis.  Okay for outpatient f/u.  Rest and fluids.  Tylenol if needed.  Albuterol for cough.  Start doxycycline, sunburn cautions.  Update [place] as needed.  She agrees.

## 2022-03-27 NOTE — Progress Notes (Signed)
duration of symptoms:~1 week sx.   rhinorrhea: yes congestion: yes ear pain: no but pressure in the ears sore throat: no cough: yes myalgias: yes She thought it was allergies initially, congestion, then fever then discolored sputum.  R>L upper back pain Last fever was about 36 hours ago.  Had chills.   She is out of SABA.  She can still take a deep breath.    Per HPI unless specifically indicated in ROS section  Meds, vitals, and allergies reviewed.   GEN: nad, alert and oriented HEENT: mucous membranes moist, TM w/o erythema, R>L SOM, nasal epithelium injected, OP with cobblestoning, sinuses not ttp x4 NECK: supple w/o LA CV: rrr. PULM: ctab, no inc wob, no wheeze, cough noted.  ABD: soft, +bs EXT: no edema Skin well perfused.

## 2022-03-27 NOTE — Patient Instructions (Signed)
Rest and fluids.  Tylenol if needed.  Albuterol for cough.  Start doxycycline, sunburn cautions.  Take care.  Glad to see you. Update [place] as needed.

## 2022-04-09 DIAGNOSIS — Z1231 Encounter for screening mammogram for malignant neoplasm of breast: Secondary | ICD-10-CM | POA: Diagnosis not present

## 2022-04-23 DIAGNOSIS — Z1389 Encounter for screening for other disorder: Secondary | ICD-10-CM | POA: Diagnosis not present

## 2022-04-23 DIAGNOSIS — Z01419 Encounter for gynecological examination (general) (routine) without abnormal findings: Secondary | ICD-10-CM | POA: Diagnosis not present

## 2022-04-23 DIAGNOSIS — Z130 Encounter for screening for diseases of the blood and blood-forming organs and certain disorders involving the immune mechanism: Secondary | ICD-10-CM | POA: Diagnosis not present

## 2022-06-07 ENCOUNTER — Encounter: Payer: Self-pay | Admitting: Family Medicine

## 2022-06-07 ENCOUNTER — Ambulatory Visit: Payer: BC Managed Care – PPO | Admitting: Family Medicine

## 2022-06-07 VITALS — BP 130/86 | HR 104 | Temp 98.3°F | Ht 68.0 in | Wt 252.5 lb

## 2022-06-07 DIAGNOSIS — J208 Acute bronchitis due to other specified organisms: Secondary | ICD-10-CM

## 2022-06-07 DIAGNOSIS — R051 Acute cough: Secondary | ICD-10-CM | POA: Diagnosis not present

## 2022-06-07 DIAGNOSIS — R062 Wheezing: Secondary | ICD-10-CM

## 2022-06-07 LAB — POC INFLUENZA A&B (BINAX/QUICKVUE)
Influenza A, POC: NEGATIVE
Influenza B, POC: NEGATIVE

## 2022-06-07 LAB — POC COVID19 BINAXNOW: SARS Coronavirus 2 Ag: NEGATIVE

## 2022-06-07 MED ORDER — PREDNISONE 20 MG PO TABS: ORAL_TABLET | ORAL | 0 refills | Status: DC

## 2022-06-07 MED ORDER — DOXYCYCLINE HYCLATE 100 MG PO TABS: 100.0000 mg | ORAL_TABLET | Freq: Two times a day (BID) | ORAL | 0 refills | Status: DC

## 2022-06-07 NOTE — Progress Notes (Signed)
    Niel Peretti T. Chenise Mulvihill, MD, [place] at [facility] [place], [address]  Phone: [phone]  FAX: [phone]  Bethany Johns - 56 y.o. female  MRN [id number]  Date of Birth: 1965/11/27  Date: 06/07/2022  PCP: Tonia Ghent, MD  Referral: Tonia Ghent, MD  Chief Complaint  Patient presents with   Cough    Productive with Green Phlegm-Started on Saturday No Covid Test    Nasal Congestion   Headache   Chills   Shortness of Breath        Fever    Low grade   Subjective:   Bethany Johns is a 56 y.o. very pleasant female patient with Body mass index is 38.39 kg/m. who presents with the following:  The patient complains of coughing, congestion, aching and fever.  Saturday started with congestion, sinus and then Monday woke up with a productive cough.  SInce then, it has been constant.  Fever to 99.4. Has had lots of chills.  The cough is the worst thing.  Feels short of breath.   Smokes - 1/2 PPD.     Review of Systems is noted in the HPI, as appropriate  Objective:   BP 130/86   Pulse (!) 104   Temp 98.3 F (36.8 C) (Oral)   Ht '5\' 8"'$  (1.727 m)   Wt 252 lb 8 oz (114.5 kg)   SpO2 98%   BMI 38.39 kg/m    Gen: WDWN, NAD. Globally Non-toxic HEENT: Throat clear, w/o exudate, R TM clear, L TM - good landmarks, No fluid present. rhinnorhea.  MMM Frontal sinuses: NT Max sinuses: NT NECK: Anterior cervical  LAD is absent CV: RRR, No M/G/R, cap refill <2 sec PULM: Breathing comfortably in no respiratory distress. + B wheezing, and scattered, rare rhonchi.   Laboratory and Imaging Data: Results for orders placed or performed in visit on 06/07/22  POC COVID-19  Result Value Ref Range   SARS Coronavirus 2 Ag Negative Negative  POC Influenza A&B (Binax test)  Result Value Ref Range   Influenza A, POC Negative Negative   Influenza B, POC Negative Negative     Assessment and Plan:   ***

## 2022-12-04 ENCOUNTER — Encounter: Payer: Self-pay | Admitting: Internal Medicine

## 2023-03-11 IMAGING — DX DG CHEST 2V
2 series · 2 of 2 positions shown · non-contrast
Comparison: None.

CLINICAL DATA: Shortness of breath for several days

EXAM:
CHEST - 2 VIEW

[chest pa]
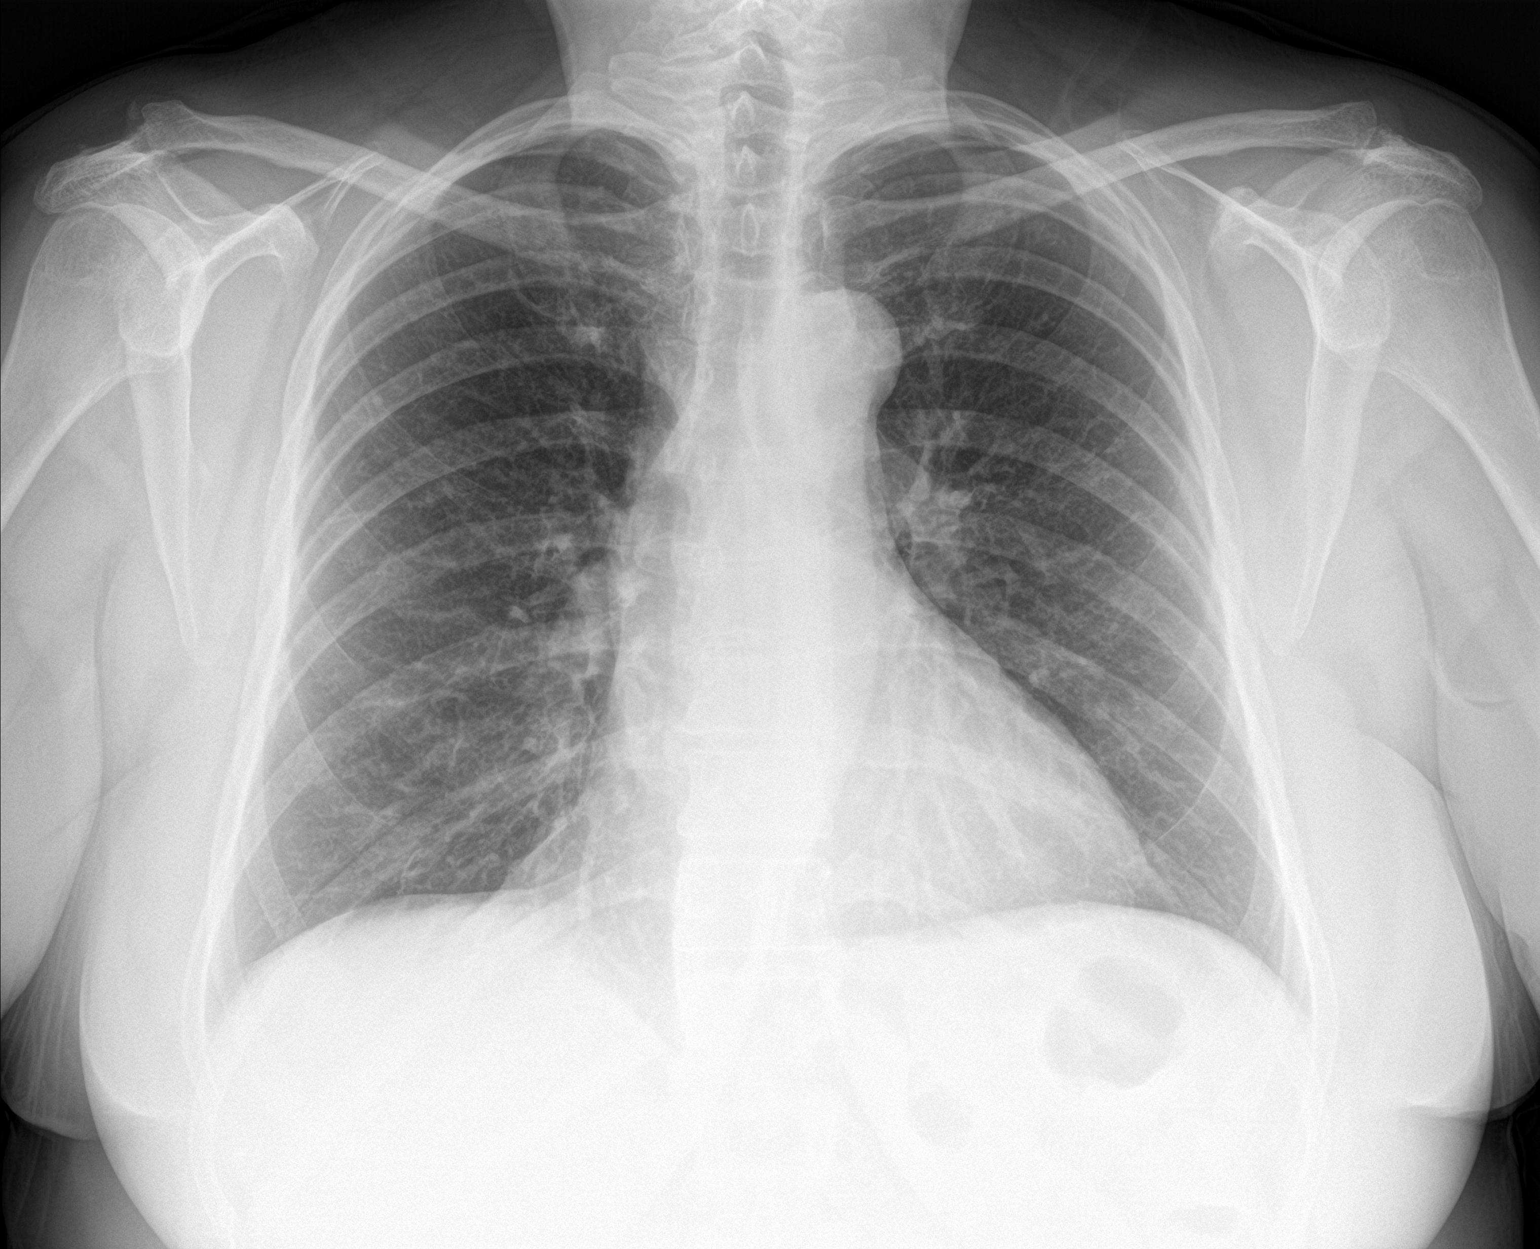

[chest lat]
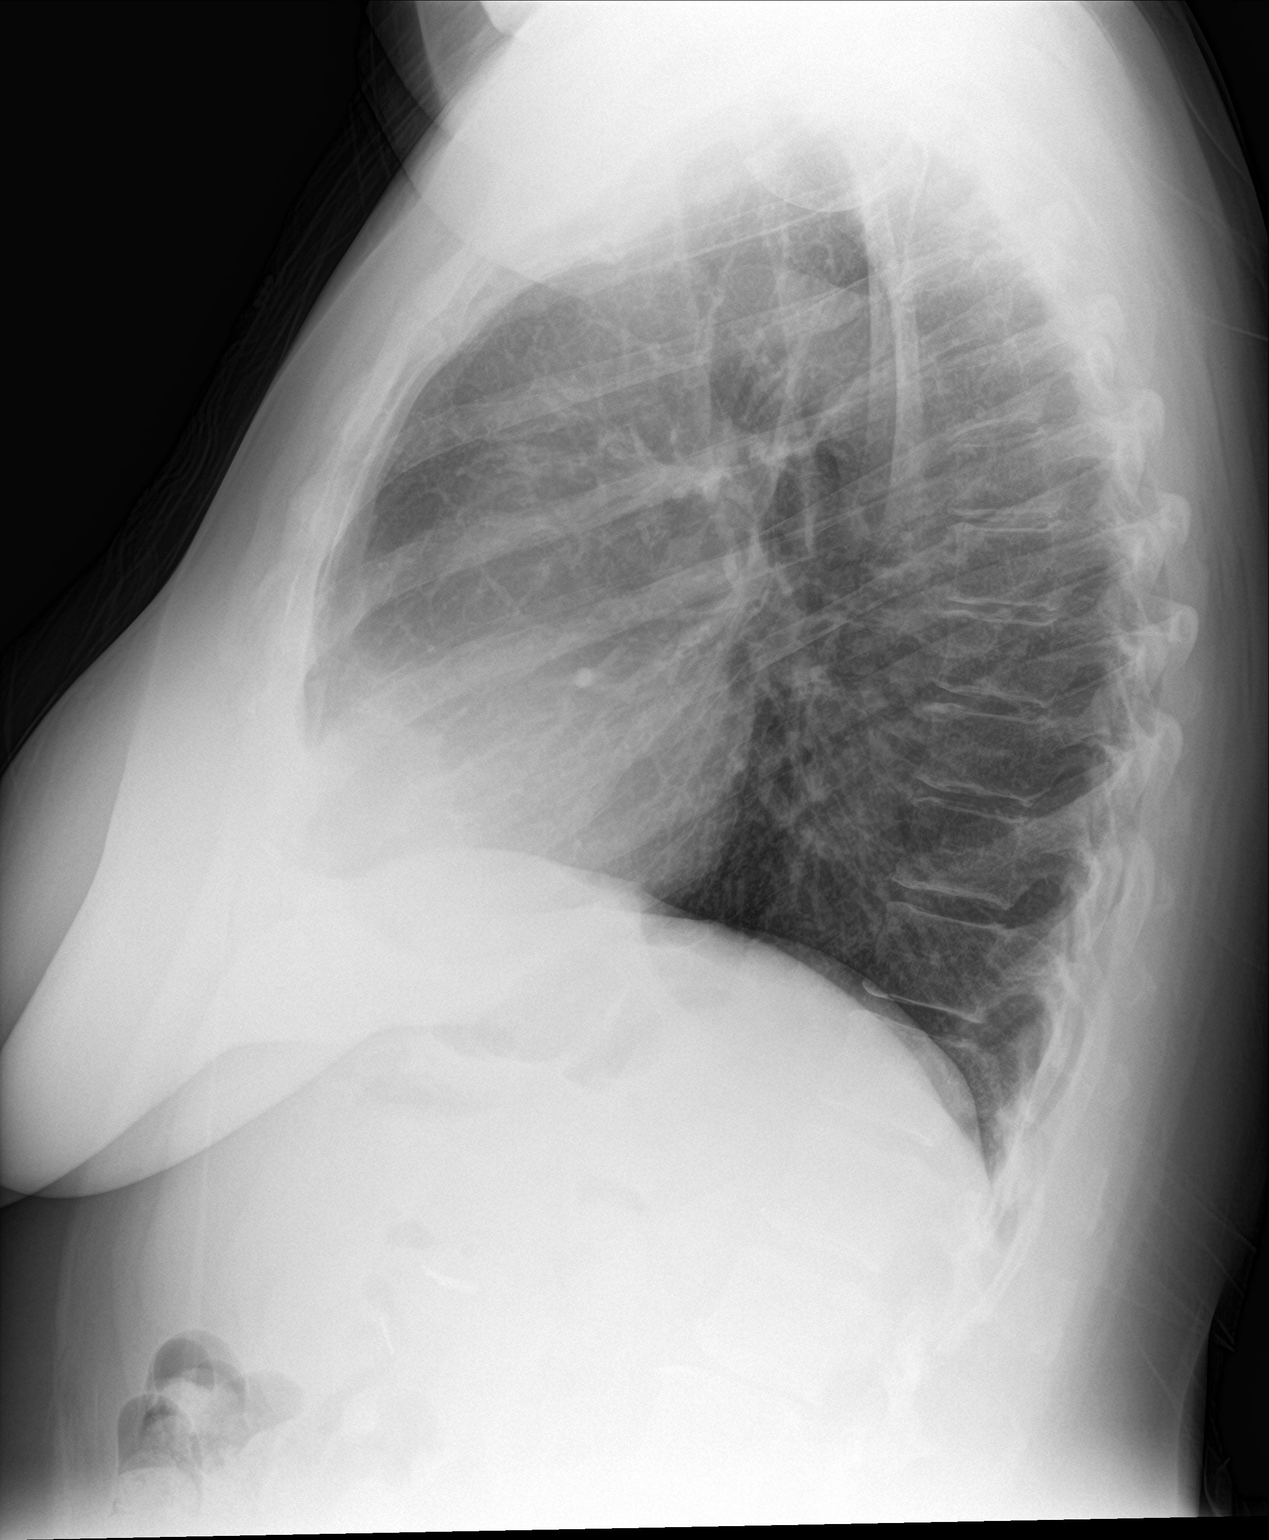

[2 of 2 positions shown; findings below may reference images not displayed]

FINDINGS: The heart size and mediastinal contours are within normal limits.
Both lungs are clear. The visualized skeletal structures are
unremarkable.
IMPRESSION: No active cardiopulmonary disease.

## 2023-04-29 DIAGNOSIS — Z130 Encounter for screening for diseases of the blood and blood-forming organs and certain disorders involving the immune mechanism: Secondary | ICD-10-CM | POA: Diagnosis not present

## 2023-04-29 DIAGNOSIS — Z1231 Encounter for screening mammogram for malignant neoplasm of breast: Secondary | ICD-10-CM | POA: Diagnosis not present

## 2023-04-29 DIAGNOSIS — Z01419 Encounter for gynecological examination (general) (routine) without abnormal findings: Secondary | ICD-10-CM | POA: Diagnosis not present

## 2023-04-29 LAB — HM MAMMOGRAPHY

## 2023-07-04 ENCOUNTER — Ambulatory Visit: Payer: BC Managed Care – PPO | Admitting: Family Medicine

## 2023-07-04 ENCOUNTER — Encounter: Payer: Self-pay | Admitting: Family Medicine

## 2023-07-04 VITALS — BP 90/68 | HR 101 | Temp 99.0°F | Ht 67.25 in | Wt 256.2 lb

## 2023-07-04 DIAGNOSIS — R6889 Other general symptoms and signs: Secondary | ICD-10-CM

## 2023-07-04 DIAGNOSIS — R509 Fever, unspecified: Secondary | ICD-10-CM

## 2023-07-04 LAB — POC INFLUENZA A&B (BINAX/QUICKVUE)
Influenza A, POC: NEGATIVE
Influenza B, POC: NEGATIVE

## 2023-07-04 LAB — POC COVID19 BINAXNOW: SARS Coronavirus 2 Ag: NEGATIVE

## 2023-07-04 NOTE — Progress Notes (Signed)
 Johnnie Goynes T. Iysis Germain, MD, CAQ Sports Medicine [facility] at [facility] [address] [place], [address]  Phone: [phone]  FAX: [phone]  Bethany Johns - 58 y.o. female  MRN [id number]  Date of Birth: 1965-09-09  Date: 07/04/2023  PCP: Cleatus Arlyss RAMAN, MD  Referral: Cleatus Arlyss RAMAN, MD  Chief Complaint  Patient presents with   Diarrhea    Started 06-Jul-2024 afternoon   Vomiting   Cough   Fever   Headache   Generalized Body Aches        Subjective:   Bethany Johns is a 58 y.o. very pleasant female patient with Body mass index is 39.84 kg/m. who presents with the following:  The patient presents for an acute illness assessment.  Feels like death Started on 06-Jul-2024 Threw up some last weekend  Diarrhea hit, and that has phased out on Monday Got a really bad headache and had some chills Mon night had a little bit of reflux Now having some coughing  Tmax 101.8  Smoker She has not wheezing and she does not have any significant shortness of breath   Review of Systems is noted in the HPI, as appropriate  Objective:   BP 90/68 (BP Location: Left Arm, Patient Position: Sitting, Cuff Size: Large)   Pulse (!) 101   Temp 99 F (37.2 C) (Temporal)   Ht 5' 7.25 (1.708 m)   Wt 256 lb 4 oz (116.2 kg)   SpO2 94%   BMI 39.84 kg/m    Gen: WDWN, cooperative. Globally Non-toxic HEENT: Normocephalic and atraumatic. Throat clear, w/o exudate, R TM clear, L TM - good landmarks, No fluid present. rhinnorhea. No frontal or maxillary sinus T. MMM NECK: Anterior cervical  LAD is present CV: RRR, No M/G/R, cap refill <2 sec PULM: Breathing comfortably in no respiratory distress. no wheezing, crackles, rhonchi ABD: S,NT,ND,+BS. No HSM. No rebound. MSK: Nml gait   Laboratory and Imaging Data: Results for orders placed or performed in visit on 07/04/23  POC Influenza A&B (Binax test)   Collection Time: 07/04/23 12:04 PM  Result Value Ref  Range   Influenza A, POC Negative Negative   Influenza B, POC Negative Negative  POC COVID-19   Collection Time: 07/04/23 12:05 PM  Result Value Ref Range   SARS Coronavirus 2 Ag Negative Negative     Assessment and Plan:     ICD-10-CM   1. Flu-like symptoms  R68.89     2. Fever, unspecified fever cause  R50.9 POC Influenza A&B (Binax test)    POC COVID-19     I think the patient has a bad flulike illness/virus.  Anticipate this will need more recovery while her body is healing.  She can continue with some supportive care.  If fever recommended Tylenol  or NSAIDs.  She is still contagious while febrile.  Medication Management during today's office visit: No orders of the defined types were placed in this encounter.  Medications Discontinued During This Encounter  Medication Reason   fluticasone (VERAMYST) 27.5 MCG/SPRAY nasal spray Completed Course   doxycycline  (VIBRA -TABS) 100 MG tablet Completed Course   predniSONE  (DELTASONE ) 20 MG tablet Completed Course   cetirizine (ZYRTEC) 10 MG tablet Completed Course    Orders placed today for conditions managed today: Orders Placed This Encounter  Procedures   POC Influenza A&B (Binax test)   POC COVID-19    Disposition: No follow-ups on file.  Dr[employer] One speech-to-text software was used for transcription in  this dictation.  Possible transcriptional errors can occur using Animal nutritionist.   Signed,  Jacques DASEN. Tuff Clabo, MD   Outpatient Encounter Medications as of 07/04/2023  Medication Sig   acetaminophen  (TYLENOL ) 500 MG tablet Take 500-1,000 mg by mouth every 6 (six) hours as needed (for pain/headaches.).   albuterol  (VENTOLIN  HFA) 108 (90 Base) MCG/ACT inhaler Inhale 1-2 puffs into the lungs every 6 (six) hours as needed.   aspirin-acetaminophen -caffeine (EXCEDRIN MIGRAINE) 250-250-65 MG tablet Take 2-3 tablets by mouth every 6 (six) hours as needed for headache.   meloxicam (MOBIC) 15 MG tablet Take 15 mg by  mouth daily.   [DISCONTINUED] cetirizine (ZYRTEC) 10 MG tablet Take 10 mg by mouth at bedtime.   [DISCONTINUED] doxycycline  (VIBRA -TABS) 100 MG tablet Take 1 tablet (100 mg total) by mouth 2 (two) times daily.   [DISCONTINUED] fluticasone (VERAMYST) 27.5 MCG/SPRAY nasal spray Place 2 sprays into the nose daily.   [DISCONTINUED] predniSONE  (DELTASONE ) 20 MG tablet 2 tabs po daily for 5 days, then 1 tab po daily for 5 days   No facility-administered encounter medications on file as of 07/04/2023.

## 2023-07-05 ENCOUNTER — Ambulatory Visit: Payer: Self-pay | Admitting: Family Medicine

## 2023-07-05 DIAGNOSIS — Z87891 Personal history of nicotine dependence: Secondary | ICD-10-CM | POA: Diagnosis not present

## 2023-07-05 DIAGNOSIS — B9789 Other viral agents as the cause of diseases classified elsewhere: Secondary | ICD-10-CM | POA: Diagnosis not present

## 2023-07-05 DIAGNOSIS — E669 Obesity, unspecified: Secondary | ICD-10-CM | POA: Diagnosis not present

## 2023-07-05 DIAGNOSIS — J189 Pneumonia, unspecified organism: Secondary | ICD-10-CM | POA: Diagnosis not present

## 2023-07-05 DIAGNOSIS — J159 Unspecified bacterial pneumonia: Secondary | ICD-10-CM | POA: Diagnosis not present

## 2023-07-05 DIAGNOSIS — E876 Hypokalemia: Secondary | ICD-10-CM | POA: Diagnosis not present

## 2023-07-05 DIAGNOSIS — R059 Cough, unspecified: Secondary | ICD-10-CM | POA: Diagnosis not present

## 2023-07-05 DIAGNOSIS — Z9049 Acquired absence of other specified parts of digestive tract: Secondary | ICD-10-CM | POA: Diagnosis not present

## 2023-07-05 DIAGNOSIS — J1000 Influenza due to other identified influenza virus with unspecified type of pneumonia: Secondary | ICD-10-CM | POA: Diagnosis not present

## 2023-07-05 DIAGNOSIS — R509 Fever, unspecified: Secondary | ICD-10-CM | POA: Diagnosis not present

## 2023-07-05 DIAGNOSIS — I10 Essential (primary) hypertension: Secondary | ICD-10-CM | POA: Diagnosis not present

## 2023-07-05 DIAGNOSIS — Z6838 Body mass index (BMI) 38.0-38.9, adult: Secondary | ICD-10-CM | POA: Diagnosis not present

## 2023-07-05 DIAGNOSIS — Z880 Allergy status to penicillin: Secondary | ICD-10-CM | POA: Diagnosis not present

## 2023-07-05 DIAGNOSIS — Z1152 Encounter for screening for COVID-19: Secondary | ICD-10-CM | POA: Diagnosis not present

## 2023-07-05 DIAGNOSIS — R918 Other nonspecific abnormal finding of lung field: Secondary | ICD-10-CM | POA: Diagnosis not present

## 2023-07-05 DIAGNOSIS — J1008 Influenza due to other identified influenza virus with other specified pneumonia: Secondary | ICD-10-CM | POA: Diagnosis not present

## 2023-07-05 DIAGNOSIS — Z7722 Contact with and (suspected) exposure to environmental tobacco smoke (acute) (chronic): Secondary | ICD-10-CM | POA: Diagnosis not present

## 2023-07-05 DIAGNOSIS — G4489 Other headache syndrome: Secondary | ICD-10-CM | POA: Diagnosis not present

## 2023-07-05 DIAGNOSIS — Z2839 Other underimmunization status: Secondary | ICD-10-CM | POA: Diagnosis not present

## 2023-07-05 DIAGNOSIS — J101 Influenza due to other identified influenza virus with other respiratory manifestations: Secondary | ICD-10-CM | POA: Insufficient documentation

## 2023-07-05 NOTE — Telephone Encounter (Signed)
 If she is clearly worse, then rec in person eval.

## 2023-07-05 NOTE — Telephone Encounter (Signed)
 Copied from CRM [id number]. Topic: Clinical - Red Word Triage >> Jul 05, 2023  8:26 AM Leotis ORN wrote: [facility] that prompted transfer to Nurse Triage: SEVERE HEADACHE Pt was seen yesterday, negative swabs. she is having fevers 101-104.1 not responding to OTC meds. PT is worried she is developing bronchitis. She is wheezing, rattling cough and breathing, fever, body aches, and massive headache, pt stated she feels like the top of her head will explode. PT wants zpack if its needed [place] and worried with [place] that she wont be able to leave and ambulance cant make it to her   Chief Complaint: fever, severe headache Symptoms: high fevers 101-104.1, trouble breathing, coughing fits, severe headache, pain in chest and muscles when coughing Frequency: continual, intermittent Pertinent Negatives: Patient denies trouble breathing, trouble getting chin to chest, stiff neck Disposition: [] ED /[] Urgent Care (no appt availability in office) / [] Appointment(In office/virtual)/ []  [facility]/ [] Home Care/ [x] Refused Recommended Disposition /[] [facility]/ []  Follow-up with PCP Additional Notes: Pt confirms seen by Dr. Watt yesterday, reporting lungs were all clear yesterday but know my body, and her sinus infections settles in my chest. Pt reporting that she hears what sounds to me like wheezing, and having trouble getting full breath can get half a breath then start coughing. Pt reporting that last night 10 pm she had temp max of 104.1 F scary, also some chills and shivering, but came down with both tylenol  and motrin  and temp been staying around 101-101.8 F. Pt reporting last night was a dreamy, crazy, fitful night. Pt reporting coughing up green sputum. Pt confirms no trouble breathing currently and denies trouble breathing with coughing but states that if coughing fit just hurts, can't breathe all the way in, and confirms that her chest will feel tight. Pt  reporting headache that when temp goes up, 8.5 to 9 out of 10 pain. Pt also reporting chest hurts really bad when cough then all muscles hurt maybe 5, 6, 6.5 out of 10 only when cough, coughing frequently. Pt reporting mouth is so dry tongue is just sticking even though drinking a gallon of water. Advised pt go to ED, pt refusing to go to ED, no one to take her there, getting bad winter weather, and refusing ambulance for transportation for just a cold, advised pt should would be going to ED for trouble breathing, as that is what she described, as well as chest pain and severe headache, all of which are concerning. Pt refusing ED and 911. Pt reporting she will likely stay home and wait it out until Monday. Advised diligent hydration, ensure fever comes down with OTC meds, try steamy bathroom to relax the respiratory system, and call if need anything in meantime. Advised ED or 911 especially if worsening. Pt requesting z-pak and further recommendations from PCP - please advise. Informed CAL of pt's refusing ED/911.  Reason for Disposition  Difficulty breathing  Answer Assessment - Initial Assessment Questions 1. TEMPERATURE: What is the most recent temperature?  How was it measured?      104.1 F at 10 pm last night, took tylenol  and motrin , did come down from there at some point 2. ONSET: When did the fever start?      Started Tuesday, about time diarrhea quit, started getting headache, got really really cold, most time been between 101 and 101.8 F 3. CHILLS: Do you have chills? If yes: How bad are they?  (e.g., none, mild, moderate, severe)   -  NONE: no chills   - MILD: feeling cold   - MODERATE: feeling very cold, some shivering (feels better under a thick blanket)   - SEVERE: feeling extremely cold with shaking chills (general body shaking, rigors; even under a thick blanket)      Chills, turns on heating pad and blanket so helps, would be shaking if not 4. OTHER SYMPTOMS:  Do you have any other symptoms besides the fever?  (e.g., abdomen pain, cough, diarrhea, earache, headache, sore throat, urination pain)     Dreamy crazy fitful night, spitting up crud that's turned greenish, severe headache. Cough so much all muscles are sore in ribs or anything, if try to take deep breath or yawn, just can't do it, Dr. Watt said lungs clear yesterday, still know her body and any time get sinus infection settles in my chest when temp goes up 8.5 to 9 out of 10. Chest hurts really bad when cough then all muscles hurt maybe 5, 6, 6.5 out of 10 only when cough, coughing frequently, dry mouth, tongue is just sticking, drinking a gallon of water 5. CAUSE: If there are no symptoms, ask: What do you think is causing the fever?      illness 6. CONTACTS: Does anyone else in the family have an infection?     Grandkids this weekend potentially, husband was also sick recently 7. TREATMENT: What have you done so far to treat this fever? (e.g., medications)     OTC meds, seen doc yesterday 8. IMMUNOCOMPROMISE: Do you have of the following: diabetes, HIV positive, splenectomy, cancer chemotherapy, chronic steroid treatment, transplant patient, etc.     denies  Protocols used: Fever-A-AH

## 2023-07-05 NOTE — Telephone Encounter (Signed)
 Left voicemail for patient to return call to office.

## 2023-07-06 DIAGNOSIS — J189 Pneumonia, unspecified organism: Secondary | ICD-10-CM | POA: Diagnosis not present

## 2023-07-07 DIAGNOSIS — J189 Pneumonia, unspecified organism: Secondary | ICD-10-CM | POA: Diagnosis not present

## 2023-07-07 DIAGNOSIS — R918 Other nonspecific abnormal finding of lung field: Secondary | ICD-10-CM | POA: Diagnosis not present

## 2023-07-08 ENCOUNTER — Telehealth: Payer: Self-pay | Admitting: Family Medicine

## 2023-07-08 DIAGNOSIS — J189 Pneumonia, unspecified organism: Secondary | ICD-10-CM | POA: Diagnosis not present

## 2023-07-08 NOTE — Telephone Encounter (Signed)
 Received message that patient is calling about appointment. I do see that the patient is scheduled to be seen on 07/11/23. Spoke with patient she is still in the hospital but I did let her know that she was scheduled

## 2023-07-08 NOTE — Telephone Encounter (Signed)
 Copied from CRM [id number]. Topic: Appointments - Appointment Info/Confirmation >> Jul 05, 2023  4:08 PM Tiffany H wrote: Patient/patient representative is calling for information regarding an appointment.

## 2023-07-08 NOTE — Telephone Encounter (Signed)
 See previous message

## 2023-07-09 ENCOUNTER — Telehealth: Payer: Self-pay

## 2023-07-09 DIAGNOSIS — R0902 Hypoxemia: Secondary | ICD-10-CM | POA: Diagnosis not present

## 2023-07-09 DIAGNOSIS — R0781 Pleurodynia: Secondary | ICD-10-CM | POA: Diagnosis not present

## 2023-07-09 DIAGNOSIS — R112 Nausea with vomiting, unspecified: Secondary | ICD-10-CM | POA: Diagnosis not present

## 2023-07-09 DIAGNOSIS — Z9049 Acquired absence of other specified parts of digestive tract: Secondary | ICD-10-CM | POA: Diagnosis not present

## 2023-07-09 DIAGNOSIS — Z885 Allergy status to narcotic agent status: Secondary | ICD-10-CM | POA: Diagnosis not present

## 2023-07-09 DIAGNOSIS — M940 Chondrocostal junction syndrome [Tietze]: Secondary | ICD-10-CM | POA: Diagnosis not present

## 2023-07-09 DIAGNOSIS — R059 Cough, unspecified: Secondary | ICD-10-CM | POA: Diagnosis not present

## 2023-07-09 DIAGNOSIS — Z792 Long term (current) use of antibiotics: Secondary | ICD-10-CM | POA: Diagnosis not present

## 2023-07-09 DIAGNOSIS — F17210 Nicotine dependence, cigarettes, uncomplicated: Secondary | ICD-10-CM | POA: Diagnosis not present

## 2023-07-09 DIAGNOSIS — Z880 Allergy status to penicillin: Secondary | ICD-10-CM | POA: Diagnosis not present

## 2023-07-09 DIAGNOSIS — J984 Other disorders of lung: Secondary | ICD-10-CM | POA: Diagnosis not present

## 2023-07-09 DIAGNOSIS — J189 Pneumonia, unspecified organism: Secondary | ICD-10-CM | POA: Diagnosis not present

## 2023-07-09 DIAGNOSIS — K219 Gastro-esophageal reflux disease without esophagitis: Secondary | ICD-10-CM | POA: Diagnosis not present

## 2023-07-09 DIAGNOSIS — R918 Other nonspecific abnormal finding of lung field: Secondary | ICD-10-CM | POA: Diagnosis not present

## 2023-07-09 NOTE — Telephone Encounter (Signed)
 Noted. Thanks.

## 2023-07-09 NOTE — Transitions of Care (Post Inpatient/ED Visit) (Signed)
   07/09/2023  Name: Bethany Johns MRN: [id number] DOB: 10/01/1965  Today's TOC FU Call Status: Today's TOC FU Call Status:: Successful TOC FU Call Completed TOC FU Call Complete Date: 07/09/23 Patient's Name and Date of Birth confirmed.  Transition Care Management Follow-up Telephone Call Date of Discharge: 07/08/23 Discharge Facility: Other [facility]) Name of Other (Non[facility]) Discharge Facility: [facility] Type of Discharge: Inpatient Admission Primary Inpatient Discharge Diagnosis:: pneumonia How have you been since you were released from the hospital?: Better Any questions or concerns?: No  Items Reviewed: Did you receive and understand the discharge instructions provided?: Yes Medications obtained,verified, and reconciled?: Yes (Medications Reviewed) Any new allergies since your discharge?: No Dietary orders reviewed?: Yes Do you have support at home?: Yes People in Home: spouse  Medications Reviewed Today: Medications Reviewed Today     Reviewed by Emmitt Pan, LPN (Licensed Practical Nurse) on 07/09/23 at [phone]  Med List Status: <None>   Medication Order Taking? Sig Documenting Provider Last Dose Status Informant  acetaminophen  (TYLENOL ) 500 MG tablet [id number] No Take 500-1,000 mg by mouth every 6 (six) hours as needed (for pain/headaches.). [provider] Taking Active Self  albuterol  (VENTOLIN  HFA) 108 (90 Base) MCG/ACT inhaler [id number] No Inhale 1-2 puffs into the lungs every 6 (six) hours as needed. Cleatus Arlyss RAMAN, MD Taking Active   aspirin-acetaminophen -caffeine (EXCEDRIN MIGRAINE) 250-250-65 MG tablet [id number] No Take 2-3 tablets by mouth every 6 (six) hours as needed for headache. [provider] Taking Active Self  meloxicam (MOBIC) 15 MG tablet [id number] No Take 15 mg by mouth daily. [provider] Taking Active             Home Care and Equipment/Supplies: Were Home Health Services Ordered?: NA Any new equipment  or medical supplies ordered?: NA  Functional Questionnaire: Do you need assistance with bathing/showering or dressing?: No Do you need assistance with meal preparation?: No Do you need assistance with eating?: No Do you have difficulty maintaining continence: No Do you need assistance with getting out of bed/getting out of a chair/moving?: No Do you have difficulty managing or taking your medications?: No  Follow up appointments reviewed: PCP Follow-up appointment confirmed?: Yes Date of PCP follow-up appointment?: 07/12/23 Follow-up Provider: [facility] Follow-up appointment confirmed?: NA Do you need transportation to your follow-up appointment?: No Do you understand care options if your condition(s) worsen?: Yes-patient verbalized understanding    SIGNATURE Pan Emmitt, LPN [facility] Nurse Health Advisor Direct Dial [phone]

## 2023-07-11 ENCOUNTER — Inpatient Hospital Stay: Payer: BC Managed Care – PPO | Admitting: Family Medicine

## 2023-07-12 ENCOUNTER — Encounter: Payer: Self-pay | Admitting: Family Medicine

## 2023-07-12 ENCOUNTER — Ambulatory Visit: Payer: BC Managed Care – PPO | Admitting: Family Medicine

## 2023-07-12 ENCOUNTER — Telehealth: Payer: Self-pay

## 2023-07-12 VITALS — BP 134/74 | HR 83 | Temp 98.4°F | Ht 67.25 in | Wt 252.7 lb

## 2023-07-12 DIAGNOSIS — J189 Pneumonia, unspecified organism: Secondary | ICD-10-CM

## 2023-07-12 DIAGNOSIS — J101 Influenza due to other identified influenza virus with other respiratory manifestations: Secondary | ICD-10-CM | POA: Diagnosis not present

## 2023-07-12 MED ORDER — ALBUTEROL SULFATE HFA 108 (90 BASE) MCG/ACT IN AERS: 1.0000 | INHALATION_SPRAY | Freq: Four times a day (QID) | RESPIRATORY_TRACT | 1 refills | Status: AC | PRN

## 2023-07-12 NOTE — Patient Instructions (Addendum)
Go to the lab on the way out.   If you have mychart we'll likely use that to update you.    CXR in 1 month.  Thank you for stopping smoking.  Take care.    You could change to meloxicam with food in the meantime but don't take with motrin.  You could still take tylenol.   When you are feeling better, I would get the routine vaccines.

## 2023-07-12 NOTE — Progress Notes (Unsigned)
Inpatient f/u.   No fever since 07/08/23.  Sputum is white.  She can get winded but is less SOB than prior.    B ribs sore.  L>R.    Scattered rhonchi.  No focal dec in BS.

## 2023-07-12 NOTE — Transitions of Care (Post Inpatient/ED Visit) (Signed)
pt was in hospital 07/05/23 -07/09/23 dx pneumonia and Influenza A. pt has finished abx but still having SOB upon exertion and wheezing. No CP or fever.Pt already has appt with Dr Para March 07/12/23 at 3:30. Pt is using albuterol inhaler to help open airways and wheezing.pt is feeling better than when in hospital but still having breathing issues. Sending note to Dr Para March . UC & ED precvautions given and pt voiced understanding,.         07/12/2023  Name: Bethany Johns. Wergin MRN: [id number] DOB: [birth year]  Today's TOC FU Call Status: Today's TOC FU Call Status:: Successful TOC FU Call Completed TOC FU Call Complete Date: 07/12/23 Patient's Name and Date of Birth confirmed.  Transition Care Management Follow-up Telephone Call Date of Discharge: 07/09/23 Discharge Facility: Other ([facility]) Name of Other ([facility]) [facility]charge Facility: [facility] Type of Discharge: Emergency Department Reason for ED Visit: Other: (pt was in hospital 07/05/23 -07/09/23 dx pneumonia and Influenza A. pt has finished abx but still having SOB upon exertion and wheezing. No CP or fever.Pt already has appt with Dr Para March 07/12/23 at 3:30.) How have you been since you were released from the hospital?: Better Any questions or concerns?: No  Items Reviewed: Did you receive and understand the discharge instructions provided?: Yes Medications obtained,verified, and reconciled?: Partial Review Completed Reason for Partial Mediation Review: pt has albuterol inhaler for wheezing and finished abx earlier this week. Any new allergies since your discharge?: No Dietary orders reviewed?: NA Do you have support at home?: Yes People in Home: spouse Name of Support/Comfort Primary Source: Vonna Kotyk  Medications Reviewed Today: Medications Reviewed Today   Medications were not reviewed in this encounter     Home Care and Equipment/Supplies: Were Home Health Services Ordered?: NA Any new equipment or medical  supplies ordered?: NA  Functional Questionnaire: Do you need assistance with bathing/showering or dressing?: No Do you need assistance with meal preparation?: No Do you need assistance with eating?: No Do you have difficulty maintaining continence: No Do you need assistance with getting out of bed/getting out of a chair/moving?: No Do you have difficulty managing or taking your medications?: No  Follow up appointments reviewed: PCP Follow-up appointment confirmed?: Yes Date of PCP follow-up appointment?: 07/12/23 Follow-up Provider: Dr Cheree Ditto [facility] Follow-up appointment confirmed?: NA Do you need transportation to your follow-up appointment?: No Do you understand care options if your condition(s) worsen?: Yes-patient verbalized understanding    SIGNATURE Lewanda Rife, LPN

## 2023-07-13 LAB — CBC WITH DIFFERENTIAL/PLATELET
Absolute Lymphocytes: 2344 {cells}/uL (ref 850–3900)
Absolute Monocytes: 856 {cells}/uL (ref 200–950)
Basophils Absolute: 47 {cells}/uL (ref 0–200)
Basophils Relative: 0.5 %
Eosinophils Absolute: 242 {cells}/uL (ref 15–500)
Eosinophils Relative: 2.6 %
HCT: 38.5 % (ref 35.0–45.0)
Hemoglobin: 13 g/dL (ref 11.7–15.5)
MCH: 31.3 pg (ref 27.0–33.0)
MCHC: 33.8 g/dL (ref 32.0–36.0)
MCV: 92.5 fL (ref 80.0–100.0)
MPV: 10.2 fL (ref 7.5–12.5)
Monocytes Relative: 9.2 %
Neutro Abs: 5813 {cells}/uL (ref 1500–7800)
Neutrophils Relative %: 62.5 %
Platelets: 496 10*3/uL — ABNORMAL HIGH (ref 140–400)
RBC: 4.16 10*6/uL (ref 3.80–5.10)
RDW: 12.2 % (ref 11.0–15.0)
Total Lymphocyte: 25.2 %
WBC: 9.3 10*3/uL (ref 3.8–10.8)

## 2023-07-13 LAB — COMPREHENSIVE METABOLIC PANEL
AG Ratio: 1.3 (calc) (ref 1.0–2.5)
ALT: 29 U/L (ref 6–29)
AST: 22 U/L (ref 10–35)
Albumin: 4 g/dL (ref 3.6–5.1)
Alkaline phosphatase (APISO): 84 U/L (ref 37–153)
BUN: 15 mg/dL (ref 7–25)
CO2: 24 mmol/L (ref 20–32)
Calcium: 9 mg/dL (ref 8.6–10.4)
Chloride: 105 mmol/L (ref 98–110)
Creat: 0.76 mg/dL (ref 0.50–1.03)
Globulin: 3 g/dL (ref 1.9–3.7)
Glucose, Bld: 99 mg/dL (ref 65–99)
Potassium: 3.7 mmol/L (ref 3.5–5.3)
Sodium: 142 mmol/L (ref 135–146)
Total Bilirubin: 0.3 mg/dL (ref 0.2–1.2)
Total Protein: 7 g/dL (ref 6.1–8.1)

## 2023-07-14 ENCOUNTER — Other Ambulatory Visit: Payer: Self-pay | Admitting: Family Medicine

## 2023-07-14 ENCOUNTER — Telehealth: Payer: Self-pay | Admitting: Family Medicine

## 2023-07-14 ENCOUNTER — Encounter: Payer: Self-pay | Admitting: Family Medicine

## 2023-07-14 DIAGNOSIS — R7989 Other specified abnormal findings of blood chemistry: Secondary | ICD-10-CM

## 2023-07-14 NOTE — Telephone Encounter (Signed)
Please check call log.  Patient reported that he has dropped call when she called into clinic re: triage.  Thanks.

## 2023-07-14 NOTE — Assessment & Plan Note (Signed)
With PNA. Done with abx and tamiflu.  She stopped smoking- I thanked her for her effort.  Again encouraged routine vaccination going forward, when recovered. She doesn't have a medical reason not to get vaccinated. Rationale d/w pt.  Rib pain should resolve. Can use mobic with food, tylenol prn.  Recheck LFTs given h/o transaminitis.  Recheck CXR in about 1 month.   Okay for outpatient f/u.

## 2023-07-22 ENCOUNTER — Encounter: Payer: Self-pay | Admitting: Internal Medicine

## 2023-07-22 ENCOUNTER — Ambulatory Visit: Payer: Self-pay | Admitting: Family Medicine

## 2023-07-22 ENCOUNTER — Ambulatory Visit: Payer: BC Managed Care – PPO | Admitting: Internal Medicine

## 2023-07-22 VITALS — BP 114/80 | HR 91 | Temp 98.6°F | Ht 67.25 in | Wt 252.0 lb

## 2023-07-22 DIAGNOSIS — J0140 Acute pansinusitis, unspecified: Secondary | ICD-10-CM | POA: Diagnosis not present

## 2023-07-22 MED ORDER — DOXYCYCLINE HYCLATE 100 MG PO TABS: 100.0000 mg | ORAL_TABLET | Freq: Two times a day (BID) | ORAL | 0 refills | Status: DC

## 2023-07-22 NOTE — Assessment & Plan Note (Signed)
Doesn't seem to have a recurrence of the lower respiratory infection--seems more sinus Will treat with doxy 100 bid x 10 days Consider z-pak if not helpful (more for lung) Analgesics Has tessalon/albuterol

## 2023-07-22 NOTE — Telephone Encounter (Signed)
I will assess her at this morning's appointment

## 2023-07-22 NOTE — Progress Notes (Signed)
Subjective:    Patient ID: Bethany Johns. Bethany Johns, female    DOB: [birth year], 58 y.o.   MRN: [id number]  HPI Here due to ongoing respiratory symptoms  Started on January 5th Went out shopping--but came back with vomiting and diarrhea Better 2 days later---but then chills and sweats Then developed fever and more cough Seem here but flu/COVID negative (1/9) Then fever spiked way up---sister called her and she seemed cloudy Called ambulance---admitted to [facility] system)---flu positive LLL pneumonia Given IV antibiotics and tamiflu Oxygen into 80's --needed oxygen.  Improved and finished cefdinir at home (sent home on 1/13). Didn't need oxygen  Then awoke with severe right side rib pain Got up and started vomiting Back to the hospital 1/14---xray and CT didn't show PE and persistent LLL infiltrate Saw Dr Para March 3 days later  Doing incentive spirometry and albuterol Then got severe spell of coughing 2 days ago Bad nasal congestion Now with right ear crackling and some pain Some right side neck pain Bad frontal headache also--seems sinus related No fever now No SOB now  Current Outpatient Medications on File Prior to Visit  Medication Sig Dispense Refill   acetaminophen (TYLENOL) 500 MG tablet Take 500-1,000 mg by mouth every 6 (six) hours as needed (for pain/headaches.).     albuterol (VENTOLIN HFA) 108 (90 Base) MCG/ACT inhaler Inhale 1-2 puffs into the lungs every 6 (six) hours as needed. 18 g 1   aspirin-acetaminophen-caffeine (EXCEDRIN MIGRAINE) 250-250-65 MG tablet Take 2-3 tablets by mouth every 6 (six) hours as needed for headache.     meloxicam (MOBIC) 15 MG tablet Take 15 mg by mouth daily.     No current facility-administered medications on file prior to visit.    Allergies  Allergen Reactions   Tramadol Itching   Amoxicillin Rash    Has patient had a PCN reaction causing immediate rash, facial/tongue/throat swelling, SOB or lightheadedness with hypotension:  No Has patient had a PCN reaction causing severe rash involving mucus membranes or skin necrosis: No Has patient had a PCN reaction that required hospitalization No Has patient had a PCN reaction occurring within the last 10 years: No If all of the above answers are "NO", then may proceed with Cephalosporin use.    Penicillins Rash    Has patient had a PCN reaction causing immediate rash, facial/tongue/throat swelling, SOB or lightheadedness with hypotension: No Has patient had a PCN reaction causing severe rash involving mucus membranes or skin necrosis: No Has patient had a PCN reaction that required hospitalization No Has patient had a PCN reaction occurring within the last 10 years: No If all of the above answers are "NO", then may proceed with Cephalosporin use.   Other reaction(s): Rash Has patient had a PCN reaction causing immediate rash, facial/tongue/throat swelling, SOB or lightheadedness with hypotension: No Has patient had a PCN reaction causing severe rash involving mucus membranes or skin necrosis: No Has patient had a PCN reaction that required hospitalization No Has patient had a PCN reaction occurring within the last 10 years: No If all of the above answers are "NO", then may proceed with Cephalosporin use. Has patient had a PCN reaction causing immediate rash, facial/tongue/throat swelling, SOB or lightheadedness with hypotension: No Has patient had a PCN reaction causing severe rash involving mucus membranes or skin necrosis: No Has patient had a PCN reaction that required hospitalization No Has patient had a PCN reaction occurring within the last 10 years: No If all of the above answers are "  NO", then may proceed with Cephalosporin use. Has patient had a PCN reaction causing immediate rash, facial/tongue/throat swelling, SOB or lightheadedness with hypotension: No Has patient had a PCN reaction causing severe rash involving mucus membranes or skin necrosis: No Has  patient had a PCN reaction that required hospitalization No Has patient had a PCN reaction occurring within the last 10 years: No If all of the above answers are "NO", then may proceed with Cephalosporin use.    Past Medical History:  Diagnosis Date   Anemia    Arthritis    both knees   Diabetes mellitus without complication (HCC)    gestational    Fibromyalgia    Headache    Pneumonia    as a child   Smoker    Tubular adenoma of colon    Vaginal delivery 1998    Past Surgical History:  Procedure Laterality Date   ANKLE ARTHROPLASTY     CHOLECYSTECTOMY N/A 06/26/2013   Procedure: LAPAROSCOPIC CHOLECYSTECTOMY WITH INTRAOPERATIVE CHOLANGIOGRAM, POSSIBLE OPEN;  Surgeon: Ernestene Mention, MD;  Location: [facility] OR;  Service: General;  Laterality: N/A;   EYE SURGERY     FOOT SURGERY     PLANTAR FASCIITIS   KNEE ARTHROSCOPY Right 08/2012   LAPAROSCOPIC SALPINGO OOPHERECTOMY Left 06/15/2015   Procedure: LAPAROSCOPIC LEFT SALPINGO OOPHORECTOMY;  Surgeon: Huel Cote, MD;  Location: [facility] ORS;  Service: Gynecology;  Laterality: Left;   LASIK     eye surgery   NASAL SEPTOPLASTY W/ TURBINOPLASTY Bilateral 05/29/2019   Procedure: NASAL SEPTOPLASTY WITH TURBINATE REDUCTION;  Surgeon: Osborn Coho, MD;  Location: [facility] OR;  Service: ENT;  Laterality: Bilateral;   NOVASURE ABLATION N/A 06/15/2015   Procedure: Cammy Copa ABLATION;  Surgeon: Huel Cote, MD;  Location: [facility] ORS;  Service: Gynecology;  Laterality: N/A;   OOPHORECTOMY      Family History  Problem Relation Age of Onset   Breast cancer Maternal Grandmother 63   Dementia Mother    Hypertension Father    Bladder Cancer Father    Healthy Daughter    Aneurysm Paternal Grandfather    Colon cancer Neg Hx    Liver disease Neg Hx    Inflammatory bowel disease Neg Hx     Social History   Socioeconomic History   Marital status: Married    Spouse name: Not on file   Number of children: Not on file   Years of education: Not on  file   Highest education level: Not on file  Occupational History   Not on file  Tobacco Use   Smoking status: Former    Current packs/day: 0.50    Average packs/day: 0.5 packs/day for 5.0 years (2.5 ttl pk-yrs)    Types: Cigarettes   Smokeless tobacco: Never   Tobacco comments:    SOCIALLY ONLY  Vaping Use   Vaping status: Never Used  Substance and Sexual Activity   Alcohol use: Yes    Comment: occasional beer   Drug use: Never   Sexual activity: Yes    Birth control/protection: None    Comment: ablation  Other Topics Concern   Not on file  Social History Narrative   Remarried 2017.   Works at [employer].   Social Drivers of Corporate investment banker Strain: Low Risk  (07/05/2023)   Received from [facility]   Overall Financial Resource Strain (CARDIA)    Difficulty of Paying Living Expenses: Not very hard  Food Insecurity: No Food Insecurity (07/05/2023)  Received from [facility]   Hunger Vital Sign    Worried About Running Out of Food in the Last Year: Never true    Ran Out of Food in the Last Year: Never true  Transportation Needs: No Transportation Needs (07/05/2023)   Received from [facility] - Transportation    Lack of Transportation (Medical): No    Lack of Transportation (Non-Medical): No  Physical Activity: Not on file  Stress: No Stress Concern Present (07/05/2023)   Received from [facility] - Occupational Stress Questionnaire    Feeling of Stress : Only a little  Social Connections: Socially Integrated (07/05/2023)   Received from [facility]   Social Connection and Isolation Panel [NHANES]    Frequency of Communication with Friends and Family: More than three times a week    Frequency of Social Gatherings with Friends and Family: More than three times a week    Attends Religious Services: More than 4 times per year    Active Member of [employer] or Organizations: Yes     Attends Engineer, structural: More than 4 times per year    Marital Status: Married  Catering manager Violence: Not At Risk (07/05/2023)   Received from [facility]   Humiliation, Afraid, Rape, and Kick questionnaire    Fear of Current or Ex-Partner: No    Emotionally Abused: No    Physically Abused: No    Sexually Abused: No    Review of Systems No N/V Appetite is off--but is eating some Has missed work throughout this time    Objective:   Physical Exam Constitutional:      General: She is not in acute distress. HENT:     Head:     Comments: Maxillary > frontal tenderness    Right Ear: Tympanic membrane and ear canal normal.     Left Ear: Tympanic membrane and ear canal normal.     Mouth/Throat:     Comments: Mild tonsillar enlargement without significant injection Neck:     Comments: Mild non tender anterior cervical nodes Pulmonary:     Effort: Pulmonary effort is normal.     Breath sounds: Normal breath sounds. No wheezing or rales.  Musculoskeletal:     Cervical back: Neck supple.  Neurological:     Mental Status: She is alert.            Assessment & Plan:

## 2023-07-22 NOTE — Telephone Encounter (Signed)
Copied from CRM [id number]. Topic: Clinical - Red Word Triage >> Jul 22, 2023  8:04 AM Deaijah H wrote: Red Word that prompted transfer to Nurse Triage: Double pneumonia not feeling better / terrible headache (1/26) , eye hurt, cough (symptoms have become worse)   Chief Complaint: Cough, Congestion,  Symptoms: Cough, Congestion, Eye Soreness Frequency: Since Friday Pertinent Negatives: Patient denies Fever, Nausea, Vomiting Disposition: [] ED /[] Urgent Care (no appt availability in office) / [x] Appointment(In office/virtual)/ []  [place] [facility]/ [] Home Care/ [] Refused Recommended Disposition /[] [facility] [facility]/ []  Follow-up with PCP Additional Notes: AB is a 58 year old female being triaged today for a non improvement in symptoms. Previously discharged on the 80 th of this month. The patient recently experienced a worsening of symptoms and stated she was told by Dr. Para March to call and be seen by him for ANY changes in symptoms to be seen by him. Patient has a history of pneumonia and bronchitis, and currently reports a cough, nasal congestion, and a earache. In office appointment made today.   Reason for Disposition  Earache  Answer Assessment - Initial Assessment Questions 1. ONSET: "When did the cough begin?"      Friday  2. SEVERITY: "How bad is the cough today?"      Moderate-Persistent  3. SPUTUM: "Describe the color of your sputum" (none, dry cough; clear, white, yellow, green)      Clear, Thick Sputum  4. HEMOPTYSIS: "Are you coughing up any blood?" If so ask: "How much?" (flecks, streaks, tablespoons, etc.)     No  5. DIFFICULTY BREATHING: "Are you having difficulty breathing?" If Yes, ask: "How bad is it?" (e.g., mild, moderate, severe)    - MILD: No SOB at rest, mild SOB with walking, speaks normally in sentences, can lie down, no retractions, pulse < 100.    - MODERATE: SOB at rest, SOB with minimal exertion and prefers to sit, cannot lie down flat, speaks in  phrases, mild retractions, audible wheezing, pulse 100-120.    - SEVERE: Very SOB at rest, speaks in single words, struggling to breathe, sitting hunched forward, retractions, pulse > 120      None, Nasal Congestion  6. FEVER: "Do you have a fever?" If Yes, ask: "What is your temperature, how was it measured, and when did it start?"     No  7. CARDIAC HISTORY: "Do you have any history of heart disease?" (e.g., heart attack, congestive heart failure)      No  8. LUNG HISTORY: "Do you have any history of lung disease?"  (e.g., pulmonary embolus, asthma, emphysema)     Bronchitis, Pneumonia  9. PE RISK FACTORS: "Do you have a history of blood clots?" (or: recent major surgery, recent prolonged travel, bedridden)     No  10. OTHER SYMPTOMS: "Do you have any other symptoms?" (e.g., runny nose, wheezing, chest pain)       Nasal Congestion, Sinus Congestion, Ear Pressure  11. PREGNANCY: "Is there any chance you are pregnant?" "When was your last menstrual period?"       No  12. TRAVEL: "Have you traveled out of the country in the last month?" (e.g., travel history, exposures)       No  Protocols used: Cough - Acute Productive-A-AH

## 2023-07-23 ENCOUNTER — Telehealth: Payer: Self-pay | Admitting: Family Medicine

## 2023-07-23 NOTE — Telephone Encounter (Signed)
Copied from CRM [id number]. Topic: General - Other >> Jul 23, 2023 11:00 AM Denese Killings wrote: Reason for CRM: Shanda Bumps with [employer] is calling to see if FMLA paperwork for patient was received and when will they be expecting it to be sent back.

## 2023-07-23 NOTE — Telephone Encounter (Signed)
Return call to Shanda Bumps to advise we did receive the paperwork and as soon as provider is in office we will get the paperwork filled out and sent back

## 2023-07-25 ENCOUNTER — Telehealth: Payer: Self-pay

## 2023-07-25 NOTE — Telephone Encounter (Signed)
Noted. Thanks.

## 2023-07-25 NOTE — Telephone Encounter (Signed)
Copied from CRM [id number]. Topic: General - Other >> Jul 25, 2023  2:12 PM Elizebeth Brooking wrote: Reason for CRM: Miguel Rota called to inform provider that patient does have access to case management her phone number is [phone]

## 2023-07-26 NOTE — Telephone Encounter (Signed)
Spoke with patient and informed her that Bethany Johns just returned to the office yesterday. Informed her that once its completed we will reach out to let her know. She stated that today is the deadline for the paperwork to be sent in.    Reason for CRM: Patient is requesting a call back in regard to the status of her FMLA paperwork.

## 2023-07-26 NOTE — Telephone Encounter (Signed)
Returned call to patient and advised that Dr. Para March is working on the [facility] paperwork but no guarantee it will be completed today but the latest should be Monday. I also spoke with Shanda Bumps with [employer] who is handling the paperwork and she said that its okay

## 2023-07-29 NOTE — Telephone Encounter (Signed)
Patient picked up ppwk.

## 2023-07-29 NOTE — Telephone Encounter (Signed)
Spoke with patient to advise that FMLA paperwork has been faxed and a copy will be available for pickup at the front desk

## 2023-08-13 ENCOUNTER — Telehealth: Payer: Self-pay | Admitting: Family Medicine

## 2023-08-13 ENCOUNTER — Other Ambulatory Visit (INDEPENDENT_AMBULATORY_CARE_PROVIDER_SITE_OTHER): Payer: BC Managed Care – PPO

## 2023-08-13 ENCOUNTER — Ambulatory Visit
Admission: RE | Admit: 2023-08-13 | Discharge: 2023-08-13 | Disposition: A | Discharge status: Home / Self Care | Payer: BC Managed Care – PPO | Source: Ambulatory Visit | Attending: Family Medicine | Admitting: Family Medicine

## 2023-08-13 DIAGNOSIS — R7989 Other specified abnormal findings of blood chemistry: Secondary | ICD-10-CM

## 2023-08-13 DIAGNOSIS — J189 Pneumonia, unspecified organism: Secondary | ICD-10-CM

## 2023-08-13 LAB — CBC WITH DIFFERENTIAL/PLATELET
Basophils Absolute: 0.1 10*3/uL (ref 0.0–0.1)
Basophils Relative: 0.9 % (ref 0.0–3.0)
Eosinophils Absolute: 0.2 10*3/uL (ref 0.0–0.7)
Eosinophils Relative: 3 % (ref 0.0–5.0)
HCT: 39.9 % (ref 36.0–46.0)
Hemoglobin: 13.1 g/dL (ref 12.0–15.0)
Lymphocytes Relative: 30 % (ref 12.0–46.0)
Lymphs Abs: 2.5 10*3/uL (ref 0.7–4.0)
MCHC: 32.8 g/dL (ref 30.0–36.0)
MCV: 94.2 fL (ref 78.0–100.0)
Monocytes Absolute: 0.7 10*3/uL (ref 0.1–1.0)
Monocytes Relative: 8.2 % (ref 3.0–12.0)
Neutro Abs: 4.9 10*3/uL (ref 1.4–7.7)
Neutrophils Relative %: 57.9 % (ref 43.0–77.0)
Platelets: 336 10*3/uL (ref 150.0–400.0)
RBC: 4.24 Mil/uL (ref 3.87–5.11)
RDW: 13.5 % (ref 11.5–15.5)
WBC: 8.4 10*3/uL (ref 4.0–10.5)

## 2023-08-13 NOTE — Telephone Encounter (Signed)
 D/w pt about she feels better, enough to go back to work.  Note done.  Labs and cxr pending.

## 2023-08-14 ENCOUNTER — Encounter: Payer: Self-pay | Admitting: Family Medicine

## 2023-08-27 ENCOUNTER — Encounter: Payer: Self-pay | Admitting: Family Medicine

## 2023-08-30 ENCOUNTER — Encounter: Payer: Self-pay | Admitting: Family Medicine

## 2023-09-04 ENCOUNTER — Encounter: Payer: Self-pay | Admitting: Family Medicine

## 2023-09-12 ENCOUNTER — Ambulatory Visit (INDEPENDENT_AMBULATORY_CARE_PROVIDER_SITE_OTHER): Admitting: Family Medicine

## 2023-09-12 ENCOUNTER — Encounter: Payer: Self-pay | Admitting: Family Medicine

## 2023-09-12 VITALS — BP 122/72 | HR 87 | Temp 98.7°F | Ht 66.93 in | Wt 259.6 lb

## 2023-09-12 DIAGNOSIS — Z1211 Encounter for screening for malignant neoplasm of colon: Secondary | ICD-10-CM

## 2023-09-12 DIAGNOSIS — Z7189 Other specified counseling: Secondary | ICD-10-CM

## 2023-09-12 DIAGNOSIS — Z8632 Personal history of gestational diabetes: Secondary | ICD-10-CM

## 2023-09-12 DIAGNOSIS — M545 Low back pain, unspecified: Secondary | ICD-10-CM

## 2023-09-12 DIAGNOSIS — Z0000 Encounter for general adult medical examination without abnormal findings: Secondary | ICD-10-CM | POA: Diagnosis not present

## 2023-09-12 NOTE — Progress Notes (Signed)
 CPE- See plan.  Routine anticipatory guidance given to patient.  See health maintenance.  The possibility exists that previously documented standard health maintenance information may have been brought forward from a previous encounter into this note.  If needed, that same information has been updated to reflect the current situation based on today's encounter.    Vaccines d/w pt. routine vaccination encouraged. Mammogram 2024 Pap per gyn Colon screening 2019, refer 2025.   Living will d/w pt. Sister Babette Relic then husband Vonna Kotyk designated next if patient were incapacitated.    She quit smoking.  She noted that she can want a cigarette in the midst of work but she can manage as is.    She noted urinary changes with clear urine and this is atypical for patient. H/o GDM.  Labs pending.    Taking meloxicam prn for her back.  Recheck Cr pending.  No ADE on med.  Lower back pain at baseline- longstanding.    We talked about concentration vs memory.  She had h/o concentration troubles in school.  Not getting lost, not leaving the stove on.  No red flag events.  I think it makes sense to address her other labs first and then we can see about this in the future.  PMH and SH reviewed  Meds, vitals, and allergies reviewed.   ROS: Per HPI.  Unless specifically indicated otherwise in HPI, the patient denies:  General: fever. Eyes: acute vision changes ENT: sore throat Cardiovascular: chest pain Respiratory: SOB GI: vomiting GU: dysuria Musculoskeletal: acute back pain Derm: acute rash Neuro: acute motor dysfunction Psych: worsening mood Endocrine: polydipsia Heme: bleeding Allergy: hayfever  GEN: nad, alert and oriented HEENT: ncat NECK: supple w/o LA CV: rrr. PULM: ctab, no inc wob ABD: soft, +bs EXT: no edema SKIN: Well-perfused.

## 2023-09-12 NOTE — Patient Instructions (Addendum)
 We'll update you about the labs.  Take care.  Glad to see you. You should get a call about seeing GI.  I would get a flu shot each fall.   Shingles shot when possible.

## 2023-09-13 LAB — COMPREHENSIVE METABOLIC PANEL
ALT: 19 U/L (ref 0–35)
AST: 16 U/L (ref 0–37)
Albumin: 4.3 g/dL (ref 3.5–5.2)
Alkaline Phosphatase: 81 U/L (ref 39–117)
BUN: 18 mg/dL (ref 6–23)
CO2: 28 meq/L (ref 19–32)
Calcium: 9.2 mg/dL (ref 8.4–10.5)
Chloride: 104 meq/L (ref 96–112)
Creatinine, Ser: 0.92 mg/dL (ref 0.40–1.20)
GFR: 69.34 mL/min (ref >60.00)
Glucose, Bld: 91 mg/dL (ref 70–99)
Potassium: 4.1 meq/L (ref 3.5–5.1)
Sodium: 140 meq/L (ref 135–145)
Total Bilirubin: 0.4 mg/dL (ref 0.2–1.2)
Total Protein: 7 g/dL (ref 6.0–8.3)

## 2023-09-13 LAB — LIPID PANEL
Cholesterol: 176 mg/dL (ref 0–200)
HDL: 57 mg/dL (ref >39.00)
LDL Cholesterol: 94 mg/dL (ref 0–99)
NonHDL: 119.46
Total CHOL/HDL Ratio: 3
Triglycerides: 125 mg/dL (ref 0.0–149.0)
VLDL: 25 mg/dL (ref 0.0–40.0)

## 2023-09-13 LAB — CBC WITH DIFFERENTIAL/PLATELET
Basophils Absolute: 0.1 10*3/uL (ref 0.0–0.1)
Basophils Relative: 1.5 % (ref 0.0–3.0)
Eosinophils Absolute: 0.3 10*3/uL (ref 0.0–0.7)
Eosinophils Relative: 3.6 % (ref 0.0–5.0)
HCT: 40.6 % (ref 36.0–46.0)
Hemoglobin: 13.6 g/dL (ref 12.0–15.0)
Lymphocytes Relative: 34.3 % (ref 12.0–46.0)
Lymphs Abs: 2.5 10*3/uL (ref 0.7–4.0)
MCHC: 33.5 g/dL (ref 30.0–36.0)
MCV: 92.8 fl (ref 78.0–100.0)
Monocytes Absolute: 0.5 10*3/uL (ref 0.1–1.0)
Monocytes Relative: 7.3 % (ref 3.0–12.0)
Neutro Abs: 3.8 10*3/uL (ref 1.4–7.7)
Neutrophils Relative %: 53.3 % (ref 43.0–77.0)
Platelets: 341 10*3/uL (ref 150.0–400.0)
RBC: 4.37 Mil/uL (ref 3.87–5.11)
RDW: 13.8 % (ref 11.5–15.5)
WBC: 7.2 10*3/uL (ref 4.0–10.5)

## 2023-09-13 LAB — URINALYSIS, ROUTINE W REFLEX MICROSCOPIC
Bilirubin Urine: NEGATIVE
Hgb urine dipstick: NEGATIVE
Ketones, ur: NEGATIVE
Leukocytes,Ua: NEGATIVE
Nitrite: NEGATIVE
RBC / HPF: NONE SEEN (ref 0–?)
Specific Gravity, Urine: 1.025 (ref 1.000–1.030)
Total Protein, Urine: NEGATIVE
Urine Glucose: NEGATIVE
Urobilinogen, UA: 0.2 (ref 0.0–1.0)
pH: 6 (ref 5.0–8.0)

## 2023-09-13 LAB — TSH: TSH: 3.37 u[IU]/mL (ref 0.35–5.50)

## 2023-09-13 LAB — HEMOGLOBIN A1C: Hgb A1c MFr Bld: 5.9 % (ref 4.6–6.5)

## 2023-09-15 ENCOUNTER — Encounter: Payer: Self-pay | Admitting: Family Medicine

## 2023-09-15 DIAGNOSIS — Z0000 Encounter for general adult medical examination without abnormal findings: Secondary | ICD-10-CM | POA: Insufficient documentation

## 2023-09-15 DIAGNOSIS — M545 Low back pain, unspecified: Secondary | ICD-10-CM | POA: Insufficient documentation

## 2023-09-15 NOTE — Assessment & Plan Note (Signed)
 Vaccines d/w pt. routine vaccination encouraged. Mammogram 2024 Pap per gyn Colon screening 2019, refer 2025.   Living will d/w pt. Sister Babette Relic then husband Vonna Kotyk designated next if patient were incapacitated.

## 2023-09-15 NOTE — Assessment & Plan Note (Signed)
 Discussed that it is okay to use a heating pad as long as she does not get skin changes.  She does not have any currently.  Discussed not having it set high enough to cause superficial burn.  Continue as needed meloxicam.

## 2023-09-15 NOTE — Assessment & Plan Note (Signed)
 Living will d/w pt. Sister Babette Relic then husband Vonna Kotyk designated next if patient were incapacitated.

## 2023-09-15 NOTE — Assessment & Plan Note (Signed)
 See notes on labs.

## 2023-09-24 ENCOUNTER — Encounter: Payer: Self-pay | Admitting: Internal Medicine

## 2023-10-23 ENCOUNTER — Ambulatory Visit (AMBULATORY_SURGERY_CENTER)

## 2023-10-23 VITALS — Ht 68.0 in | Wt 255.0 lb

## 2023-10-23 DIAGNOSIS — Z860100 Personal history of colon polyps, unspecified: Secondary | ICD-10-CM

## 2023-10-23 MED ORDER — NA SULFATE-K SULFATE-MG SULF 17.5-3.13-1.6 GM/177ML PO SOLN: 1.0000 | Freq: Once | ORAL | 0 refills | Status: AC

## 2023-10-23 NOTE — Progress Notes (Signed)

## 2023-10-28 ENCOUNTER — Encounter: Payer: Self-pay | Admitting: Internal Medicine

## 2023-10-30 ENCOUNTER — Encounter (HOSPITAL_COMMUNITY): Payer: Self-pay

## 2023-11-04 ENCOUNTER — Telehealth: Payer: Self-pay | Admitting: Internal Medicine

## 2023-11-04 NOTE — Telephone Encounter (Signed)
 Pt has a "tiny salad"  "Desert size bowl with  very small amount of lettuce, a few shredded carrot and egg  at lunch on Mother Days. Instructed to really hydr at least 8-10 oz every hour till she has to stop prior to procedure time. Informed pt prep is good for 1 year and Pre-visit is good for 90 days. If she decides to cancel and reschedule. No other quesitons at this time.

## 2023-11-04 NOTE — Telephone Encounter (Signed)
 PT is scheduled for a colonoscopy on 5/14 and yesterday she had a side salad. Will she be okay to still have the procedure. Please advise.

## 2023-11-06 ENCOUNTER — Encounter: Payer: Self-pay | Admitting: Internal Medicine

## 2023-11-06 ENCOUNTER — Ambulatory Visit: Admitting: Internal Medicine

## 2023-11-06 VITALS — BP 129/89 | HR 74 | Temp 97.9°F | Resp 10 | Ht 68.0 in | Wt 255.0 lb

## 2023-11-06 DIAGNOSIS — Z860100 Personal history of colon polyps, unspecified: Secondary | ICD-10-CM

## 2023-11-06 DIAGNOSIS — D124 Benign neoplasm of descending colon: Secondary | ICD-10-CM | POA: Diagnosis not present

## 2023-11-06 DIAGNOSIS — D120 Benign neoplasm of cecum: Secondary | ICD-10-CM

## 2023-11-06 DIAGNOSIS — D122 Benign neoplasm of ascending colon: Secondary | ICD-10-CM

## 2023-11-06 DIAGNOSIS — K648 Other hemorrhoids: Secondary | ICD-10-CM | POA: Diagnosis not present

## 2023-11-06 DIAGNOSIS — Z1211 Encounter for screening for malignant neoplasm of colon: Secondary | ICD-10-CM | POA: Diagnosis not present

## 2023-11-06 DIAGNOSIS — K5730 Diverticulosis of large intestine without perforation or abscess without bleeding: Secondary | ICD-10-CM | POA: Diagnosis not present

## 2023-11-06 MED ORDER — SODIUM CHLORIDE 0.9 % IV SOLN
500.0000 mL | Freq: Once | INTRAVENOUS | Status: DC
Start: 2023-11-06 — End: 2023-11-06

## 2023-11-06 NOTE — Progress Notes (Signed)
 Report to PACU, RN, vss, BBS= Clear.

## 2023-11-06 NOTE — Progress Notes (Signed)
 Pt's states no medical or surgical changes since previsit or office visit.

## 2023-11-06 NOTE — Patient Instructions (Addendum)
 Resume previous diet.                           - Continue present medications.                           - Await pathology results.                           - Repeat colonoscopy is recommended for                            surveillance. The colonoscopy date will be                            determined after pathology results from today's                            exam become available for review.  Handout on polyps given.     YOU HAD AN ENDOSCOPIC PROCEDURE TODAY AT THE [facility]:   Refer to the procedure report that was given to you for any specific questions about what was found during the examination.  If the procedure report does not answer your questions, please call your gastroenterologist to clarify.  If you requested that your care partner not be given the details of your procedure findings, then the procedure report has been included in a sealed envelope for you to review at your convenience later.  YOU SHOULD EXPECT: Some feelings of bloating in the abdomen. Passage of more gas than usual.  Walking can help get rid of the air that was put into your GI tract during the procedure and reduce the bloating. If you had a lower endoscopy (such as a colonoscopy or flexible sigmoidoscopy) you may notice spotting of blood in your stool or on the toilet paper. If you underwent a bowel prep for your procedure, you may not have a normal bowel movement for a few days.  Please Note:  You might notice some irritation and congestion in your nose or some drainage.  This is from the oxygen used during your procedure.  There is no need for concern and it should clear up in a day or so.  SYMPTOMS TO REPORT IMMEDIATELY:  Following lower endoscopy (colonoscopy or flexible sigmoidoscopy):  Excessive amounts of blood in the stool  Significant tenderness or worsening of abdominal pains  Swelling of the abdomen that is new, acute  Fever of 100F or higher  For urgent or emergent issues,  a gastroenterologist can be reached at any hour by calling [phone]. Do not use MyChart messaging for urgent concerns.    DIET:  We do recommend a small meal at first, but then you may proceed to your regular diet.  Drink plenty of fluids but you should avoid alcoholic beverages for 24 hours.  ACTIVITY:  You should plan to take it easy for the rest of today and you should NOT DRIVE or use heavy machinery until tomorrow (because of the sedation medicines used during the test).    FOLLOW UP: Our staff will call the number listed on your records the next business day following your procedure.  We will call around 7:15- 8:00 am to check on you and address any questions  or concerns that you may have regarding the information given to you following your procedure. If we do not reach you, we will leave a message.     If any biopsies were taken you will be contacted by phone or by letter within the next 1-3 weeks.  Please call us  at [phone] if you have not heard about the biopsies in 3 weeks.    SIGNATURES/CONFIDENTIALITY: You and/or your care partner have signed paperwork which will be entered into your electronic medical record.  These signatures attest to the fact that that the information above on your After Visit Summary has been reviewed and is understood.  Full responsibility of the confidentiality of this discharge information lies with you and/or your care-partner.

## 2023-11-06 NOTE — Op Note (Addendum)
 [facility] Patient Name: Bethany Johns Procedure Date: 11/06/2023 8:02 AM MRN: [id number] Endoscopist: Nannette Babe , MD, [id number] Age: 58 Referring MD:  Date of Birth: 1965/12/14 Gender: Female Account #: [id number] Procedure:                Colonoscopy Indications:              High risk colon cancer surveillance: Personal                            history of colonic polyps, Last colonoscopy: 2019 Medicines:                Monitored Anesthesia Care Procedure:                Pre-Anesthesia Assessment:                           - Prior to the procedure, a History and Physical                            was performed, and patient medications and                            allergies were reviewed. The patient's tolerance of                            previous anesthesia was also reviewed. The risks                            and benefits of the procedure and the sedation                            options and risks were discussed with the patient.                            All questions were answered, and informed consent                            was obtained. Prior Anticoagulants: The patient has                            taken no anticoagulant or antiplatelet agents. ASA                            Grade Assessment: II - A patient with mild systemic                            disease. After reviewing the risks and benefits,                            the patient was deemed in satisfactory condition to                            undergo the procedure.  After obtaining informed consent, the colonoscope                            was passed under direct vision. Throughout the                            procedure, the patient's blood pressure, pulse, and                            oxygen saturations were monitored continuously. The                            CF [id number] #[id number] was introduced through the anus                            and advanced  to the cecum, identified by                            appendiceal orifice and ileocecal valve. The                            colonoscopy was performed without difficulty. The                            patient tolerated the procedure well. The quality                            of the bowel preparation was good. The ileocecal                            valve, appendiceal orifice, and rectum were                            photographed. Scope In: 8:38:09 AM Scope Out: 8:52:53 AM Scope Withdrawal Time: 0 hours 11 minutes 25 seconds  Total Procedure Duration: 0 hours 14 minutes 44 seconds  Findings:                 The digital rectal exam was normal.                           A 6 mm polyp was found in the cecum. The polyp was                            sessile. The polyp was removed with a cold snare.                            Resection and retrieval were complete.                           A 3 mm polyp was found in the ascending colon. The                            polyp was sessile. The polyp was  removed with a                            cold snare. Resection and retrieval were complete.                           A 5 mm polyp was found in the descending colon. The                            polyp was sessile. The polyp was removed with a                            cold snare. Resection and retrieval were complete.                           Multiple medium-mouthed and small-mouthed                            diverticula were found in the sigmoid colon,                            descending colon and ascending colon.                           Internal hemorrhoids were found during                            retroflexion. The hemorrhoids were small. Complications:            No immediate complications. Estimated Blood Loss:     Estimated blood loss: none. Impression:               - One 6 mm polyp in the cecum, removed with a cold                            snare. Resected and  retrieved.                           - One 3 mm polyp in the ascending colon, removed                            with a cold snare. Resected and retrieved.                           - One 5 mm polyp in the descending colon, removed                            with a cold snare. Resected and retrieved.                           - Moderate diverticulosis in the sigmoid colon, in                            the descending colon and in the ascending colon.                           -  Small internal hemorrhoids. Recommendation:           - Patient has a contact number available for                            emergencies. The signs and symptoms of potential                            delayed complications were discussed with the                            patient. Return to normal activities tomorrow.                            Written discharge instructions were provided to the                            patient.                           - Resume previous diet.                           - Continue present medications.                           - Await pathology results.                           - Repeat colonoscopy is recommended for                            surveillance. The colonoscopy date will be                            determined after pathology results from today's                            exam become available for review. Nannette Babe, MD 11/06/2023 8:58:31 AM This report has been signed electronically.

## 2023-11-06 NOTE — Progress Notes (Signed)
 Called to room to assist during endoscopic procedure.  Patient ID and intended procedure confirmed with present staff. Received instructions for my participation in the procedure from the performing physician.

## 2023-11-06 NOTE — Progress Notes (Signed)
 GASTROENTEROLOGY PROCEDURE H&P NOTE   Primary Care Physician: Donnie Galea, MD    Reason for Procedure:   Hx of polyps  Plan:    colonoscopy  Patient is appropriate for endoscopic procedure(s) in the ambulatory ([facility]) setting.  The nature of the procedure, as well as the risks, benefits, and alternatives were carefully and thoroughly reviewed with the patient. Ample time for discussion and questions allowed. The patient understood, was satisfied, and agreed to proceed.     HPI: Bethany Johns. Gelb is a 58 y.o. female who presents for surveillance colonoscopy.  Medical history as below.  Tolerated the prep.  No recent chest pain or shortness of breath.  No abdominal pain today.  Past Medical History:  Diagnosis Date   Anemia    Arthritis    both knees   Diabetes mellitus without complication (HCC)    gestational    Fibromyalgia    GERD (gastroesophageal reflux disease)    Headache    Pneumonia    as a child   Smoker    Tubular adenoma of colon    Vaginal delivery 1998    Past Surgical History:  Procedure Laterality Date   ANKLE ARTHROPLASTY     CHOLECYSTECTOMY N/A 06/26/2013   Procedure: LAPAROSCOPIC CHOLECYSTECTOMY WITH INTRAOPERATIVE CHOLANGIOGRAM, POSSIBLE OPEN;  Surgeon: Levert Ready, MD;  Location: [facility] OR;  Service: General;  Laterality: N/A;   EYE SURGERY     FOOT SURGERY     PLANTAR FASCIITIS   KNEE ARTHROSCOPY Right 08/2012   LAPAROSCOPIC SALPINGO OOPHERECTOMY Left 06/15/2015   Procedure: LAPAROSCOPIC LEFT SALPINGO OOPHORECTOMY;  Surgeon: Rogene Claude, MD;  Location: [facility] ORS;  Service: Gynecology;  Laterality: Left;   LASIK     eye surgery   NASAL SEPTOPLASTY W/ TURBINOPLASTY Bilateral 05/29/2019   Procedure: NASAL SEPTOPLASTY WITH TURBINATE REDUCTION;  Surgeon: Ammon Bales, MD;  Location: [facility] OR;  Service: ENT;  Laterality: Bilateral;   NOVASURE ABLATION N/A 06/15/2015   Procedure: Shonna Downy ABLATION;  Surgeon: Rogene Claude, MD;  Location:  [facility] ORS;  Service: Gynecology;  Laterality: N/A;    Prior to Admission medications   Medication Sig Start Date End Date Taking? Authorizing Provider  acetaminophen  (TYLENOL ) 500 MG tablet Take 500-1,000 mg by mouth every 6 (six) hours as needed (for pain/headaches.).    [provider]  albuterol  (VENTOLIN  HFA) 108 (90 Base) MCG/ACT inhaler Inhale 1-2 puffs into the lungs every 6 (six) hours as needed. 07/12/23   Donnie Galea, MD  ASHWAGANDHA PO Take by mouth.    [provider]  aspirin-acetaminophen -caffeine (EXCEDRIN MIGRAINE) 250-250-65 MG tablet Take 2-3 tablets by mouth every 6 (six) hours as needed for headache.    [provider]  FT APPLE CIDER VINEGAR PO Take by mouth.    [provider]  meloxicam (MOBIC) 15 MG tablet Take 15 mg by mouth as needed. 11/26/19   [provider]  Moringa Oleifera (MORINGA PO) Take by mouth.    [provider]    Current Outpatient Medications  Medication Sig Dispense Refill   acetaminophen  (TYLENOL ) 500 MG tablet Take 500-1,000 mg by mouth every 6 (six) hours as needed (for pain/headaches.).     albuterol  (VENTOLIN  HFA) 108 (90 Base) MCG/ACT inhaler Inhale 1-2 puffs into the lungs every 6 (six) hours as needed. 18 g 1   ASHWAGANDHA PO Take by mouth.     aspirin-acetaminophen -caffeine (EXCEDRIN MIGRAINE) 250-250-65 MG tablet Take 2-3 tablets by mouth every 6 (six) hours as  needed for headache.     FT APPLE CIDER VINEGAR PO Take by mouth.     meloxicam (MOBIC) 15 MG tablet Take 15 mg by mouth as needed.     Moringa Oleifera (MORINGA PO) Take by mouth.     Current Facility-Administered Medications  Medication Dose Route Frequency Provider Last Rate Last Admin   0.9 %  sodium chloride  infusion  500 mL Intravenous Once Jediah Horger, Amber Bail, MD        Allergies as of 11/06/2023 - Review Complete 11/06/2023  Allergen Reaction Noted   Amoxicillin Rash 06/19/2013   Penicillins Rash 06/19/2013   Tramadol   Itching 03/19/2017    Family History  Problem Relation Age of Onset   Dementia Mother    Hypertension Father    Bladder Cancer Father    Breast cancer Maternal Grandmother 40   Aneurysm Paternal Grandfather    Healthy Daughter    Colon cancer Neg Hx    Liver disease Neg Hx    Inflammatory bowel disease Neg Hx    Colon polyps Neg Hx    Esophageal cancer Neg Hx    Rectal cancer Neg Hx    Stomach cancer Neg Hx     Social History   Socioeconomic History   Marital status: Married    Spouse name: Not on file   Number of children: Not on file   Years of education: Not on file   Highest education level: Not on file  Occupational History   Not on file  Tobacco Use   Smoking status: Former    Current packs/day: 0.00    Average packs/day: 0.5 packs/day for 5.0 years (2.5 ttl pk-yrs)    Types: Cigarettes    Quit date: 06/2023    Years since quitting: 0.3   Smokeless tobacco: Never   Tobacco comments:    SOCIALLY ONLY  Vaping Use   Vaping status: Never Used  Substance and Sexual Activity   Alcohol use: Yes    Comment: occasional beer   Drug use: Never   Sexual activity: Yes    Birth control/protection: None    Comment: ablation  Other Topics Concern   Not on file  Social History Narrative   Remarried 2017.   Works at [employer].   Social Drivers of Corporate investment banker Strain: Low Risk  (07/05/2023)   Received from [facility]   Overall Financial Resource Strain (CARDIA)    Difficulty of Paying Living Expenses: Not very hard  Food Insecurity: No Food Insecurity (07/05/2023)   Received from [facility]   Hunger Vital Sign    Worried About Running Out of Food in the Last Year: Never true    Ran Out of Food in the Last Year: Never true  Transportation Needs: No Transportation Needs (07/05/2023)   Received [facility] - Transportation    Lack of Transportation (Medical): No    Lack of Transportation (Non-Medical): No   Physical Activity: Not on file  Stress: No Stress Concern Present (07/05/2023)   Received from [facility] - Occupational Stress Questionnaire    Feeling of Stress : Only a little  Social Connections: Socially Integrated (07/05/2023)   Received from [facility]   Social Connection and Isolation Panel [NHANES]    Frequency of Communication with Friends and Family: More than three times a week    Frequency of Social Gatherings with Friends and  Family: More than three times a week    Attends Religious Services: More than 4 times per year    Active Member of Clubs or Organizations: Yes    Attends Banker Meetings: More than 4 times per year    Marital Status: Married  Catering manager Violence: Not At Risk (07/05/2023)   Received from [facility]   Humiliation, Afraid, Rape, and Kick questionnaire    Fear of Current or Ex-Partner: No    Emotionally Abused: No    Physically Abused: No    Sexually Abused: No    Physical Exam: Vital signs in last 24 hours: @BP  139/89   Pulse 98   Temp 97.9 F (36.6 C)   Ht 5\' 8"  (1.727 m)   Wt 255 lb (115.7 kg)   SpO2 96%   BMI 38.77 kg/m  GEN: NAD EYE: Sclerae anicteric ENT: MMM CV: Non-tachycardic Pulm: CTA b/l GI: Soft, NT/ND NEURO:  Alert & Oriented x 3   Laurell Pond, MD [facility]  11/06/2023 8:02 AM

## 2023-11-07 ENCOUNTER — Telehealth: Payer: Self-pay

## 2023-11-07 NOTE — Telephone Encounter (Signed)
  Follow up Call-     11/06/2023    7:39 AM  Call back number  Post procedure Call Back phone  # [phone]  Permission to leave phone message Yes     Patient questions:  Do you have a fever, pain , or abdominal swelling? No. Pain Score  0 *  Have you tolerated food without any problems? Yes.    Have you been able to return to your normal activities? Yes.    Do you have any questions about your discharge instructions: Diet   No. Medications  No. Follow up visit  No.  Do you have questions or concerns about your Care? No.  Actions: * If pain score is 4 or above: No action needed, pain <4.

## 2023-11-12 LAB — SURGICAL PATHOLOGY

## 2023-11-14 ENCOUNTER — Ambulatory Visit: Payer: Self-pay | Admitting: Internal Medicine

## 2024-03-25 ENCOUNTER — Ambulatory Visit: Admitting: Family Medicine

## 2024-03-25 ENCOUNTER — Ambulatory Visit: Payer: Self-pay

## 2024-03-25 ENCOUNTER — Encounter: Payer: Self-pay | Admitting: Family Medicine

## 2024-03-25 VITALS — BP 134/86 | HR 81 | Temp 98.6°F | Ht 68.0 in | Wt 245.2 lb

## 2024-03-25 DIAGNOSIS — J22 Unspecified acute lower respiratory infection: Secondary | ICD-10-CM | POA: Insufficient documentation

## 2024-03-25 LAB — POC COVID19 BINAXNOW: SARS Coronavirus 2 Ag: NEGATIVE

## 2024-03-25 MED ORDER — AZITHROMYCIN 250 MG PO TABS
ORAL_TABLET | ORAL | 0 refills | Status: AC
Start: 2024-03-25 — End: 2024-03-30

## 2024-03-25 MED ORDER — HYDROCOD POLI-CHLORPHE POLI ER 10-8 MG/5ML PO SUER: 5.0000 mL | Freq: Two times a day (BID) | ORAL | 0 refills | Status: DC | PRN

## 2024-03-25 NOTE — Progress Notes (Signed)
 Ph: [phone] Fax: [phone]   Patient ID: Bethany Johns. [place], female    DOB: 1965/12/10, 58 y.o.   MRN: [id number]  This visit was conducted in person.  BP 134/86   Pulse 81   Temp 98.6 F (37 C) (Oral)   Ht 5' 8 (1.727 m)   Wt 245 lb 4 oz (111.2 kg)   SpO2 98%   BMI 37.29 kg/m    CC: cough, dyspnea  Subjective:   HPI: Bethany Johns is a 58 y.o. female presenting on 03/25/2024 for Shortness of Breath (C/o SOB, which causes cough. Started about 1 mo ago. Pt concerned due to h/o PNA. )   Dr Cleatus patient new to me presents with 1 month of respiratory symptoms. Symptoms started with bad sore throat progressing to nasal congestion, sinus pressures. She went on business trip throughout [place]. Started feeling worse - with bad cough productive of mildly colored mucous, congestion (chest > head). Initial chills. When she got home started full supportive measures with overall improvement however notes persistent cough, congestion, hoarse voice, recurrent sore throat over the weekend. Notes decreased energy and fatigue, chronic. PNDrainage.   No abd pain, nausea, diarrhea, wheezing, ear or tooth pain.   Recent prolonged travel, however no unilateral leg swelling, no h/o blood clots or fmhx blood clots, no HRT.   No sick contacts at home.  Didn't check for COVID.  No h/o asthma.  Ex smoker - quit 8 months ago!   Hospitalized for influenza B pneumonia 06/2023.  Last CXR 08/2023 - report reviewed, WNL H/o pneumonia and possible RSV as an infant.  Notes respiratory illnesses quickly spread to chest.   Daughter currently hospitalized, recovering after lithotripsy.      Relevant past medical, surgical, family and social history reviewed and updated as indicated. Interim medical history since our last visit reviewed. Allergies and medications reviewed and updated. Outpatient Medications Prior to Visit  Medication Sig Dispense Refill   acetaminophen  (TYLENOL ) 500 MG tablet  Take 500-1,000 mg by mouth every 6 (six) hours as needed (for pain/headaches.).     albuterol  (VENTOLIN  HFA) 108 (90 Base) MCG/ACT inhaler Inhale 1-2 puffs into the lungs every 6 (six) hours as needed. 18 g 1   ASHWAGANDHA PO Take by mouth.     aspirin-acetaminophen -caffeine (EXCEDRIN MIGRAINE) 250-250-65 MG tablet Take 2-3 tablets by mouth every 6 (six) hours as needed for headache.     Ferrous Sulfate (IRON PO) Take by mouth daily.     FT APPLE CIDER VINEGAR PO Take by mouth.     meloxicam (MOBIC) 15 MG tablet Take 15 mg by mouth as needed.     Moringa Oleifera (MORINGA PO) Take by mouth.     No facility-administered medications prior to visit.     Per HPI unless specifically indicated in ROS section below Review of Systems  Objective:  BP 134/86   Pulse 81   Temp 98.6 F (37 C) (Oral)   Ht 5' 8 (1.727 m)   Wt 245 lb 4 oz (111.2 kg)   SpO2 98%   BMI 37.29 kg/m   Wt Readings from Last 3 Encounters:  03/25/24 245 lb 4 oz (111.2 kg)  11/06/23 255 lb (115.7 kg)  10/23/23 255 lb (115.7 kg)      Physical Exam Vitals and nursing note reviewed.  Constitutional:      Appearance: Normal appearance. She is not ill-appearing.  HENT:     Head: Normocephalic and atraumatic.  Right Ear: Tympanic membrane, ear canal and external ear normal. There is no impacted cerumen.     Left Ear: Tympanic membrane, ear canal and external ear normal. There is no impacted cerumen.     Nose: No congestion.     Right Sinus: No maxillary sinus tenderness or frontal sinus tenderness.     Left Sinus: No maxillary sinus tenderness or frontal sinus tenderness.     Comments: Wearing mask    Mouth/Throat:     Mouth: Mucous membranes are moist.     Pharynx: Oropharynx is clear. No oropharyngeal exudate or posterior oropharyngeal erythema.  Eyes:     Extraocular Movements: Extraocular movements intact.     Conjunctiva/sclera: Conjunctivae normal.     Pupils: Pupils are equal, round, and reactive to  light.  Cardiovascular:     Rate and Rhythm: Normal rate and regular rhythm.     Pulses: Normal pulses.     Heart sounds: Normal heart sounds. No murmur heard. Pulmonary:     Effort: Pulmonary effort is normal. No respiratory distress.     Breath sounds: Normal breath sounds. No wheezing, rhonchi or rales.     Comments: Lungs overall clear, does have persistent cough present throughout OV Lymphadenopathy:     Head:     Right side of head: No submental, submandibular, tonsillar, preauricular or posterior auricular adenopathy.     Left side of head: No submental, submandibular, tonsillar, preauricular or posterior auricular adenopathy.     Cervical: No cervical adenopathy.     Right cervical: No superficial cervical adenopathy.    Left cervical: No superficial cervical adenopathy.     Upper Body:     Right upper body: No supraclavicular adenopathy.     Left upper body: No supraclavicular adenopathy.  Skin:    Findings: No rash.  Neurological:     Mental Status: She is alert.  Psychiatric:        Mood and Affect: Mood normal.        Behavior: Behavior normal.       DG Chest 2 View CLINICAL DATA:  58 year old female with a history of f/u PNA  EXAM: CHEST - 2 VIEW  COMPARISON:  03/13/2021  FINDINGS: Cardiomediastinal silhouette unchanged in size and contour. No evidence of central vascular congestion. No interlobular septal thickening.  No pneumothorax or pleural effusion. Coarsened interstitial markings, with no confluent airspace disease.  No acute displaced fracture. Degenerative changes of the spine.  IMPRESSION: No active cardiopulmonary disease.  Electronically Signed   By: Ami Bellman D.O.   On: 08/26/2023 10:55    Assessment & Plan:   Problem List Items Addressed This Visit     Acute lower respiratory infection - Primary   Suspect acute bacterial bronchitis. Given duration and progession of symptoms (acute worsening after initial improvement), treat  with zpack antibiotic to cover atypical infection. Not consistent with pneumonia (no fever, maintaining O2 sats, lung exam reassuring). Rx tussionex cough syrup. Update if not improving with treatment.       Relevant Medications   azithromycin  (ZITHROMAX ) 250 MG tablet   Other Relevant Orders   POC COVID-19 BinaxNow (Completed)     Meds ordered this encounter  Medications   azithromycin  (ZITHROMAX ) 250 MG tablet    Sig: Take 2 tablets on day 1, then 1 tablet daily on days 2 through 5    Dispense:  6 tablet    Refill:  0   DISCONTD: chlorpheniramine-HYDROcodone  (TUSSIONEX) 10-8 MG/5ML    Sig:  Take 5 mLs by mouth every 12 (twelve) hours as needed for cough (sedation).    Dispense:  120 mL    Refill:  0   chlorpheniramine-HYDROcodone  (TUSSIONEX) 10-8 MG/5ML    Sig: Take 5 mLs by mouth every 12 (twelve) hours as needed for cough (sedation).    Dispense:  120 mL    Refill:  0    Orders Placed This Encounter  Procedures   POC COVID-19 BinaxNow    Patient Instructions  You have an acute respiratory infection, possible bronchitis. Treat with zpack antibiotic (azithromycin ) sent to pharmacy Use medication as prescribed: tussionex Push fluids and plenty of rest. Please return if you are not improving as expected, or if you have high fevers (>101.5) or difficulty swallowing or worsening productive cough.  Call clinic with questions.  Good to see you today. I hope you start feeling better soon!   Follow up plan: Return if symptoms worsen or fail to improve.  Anton Blas, MD

## 2024-03-25 NOTE — Telephone Encounter (Signed)
 FYI Only or Action Required?: FYI only for provider.  Patient was last seen in primary care on 09/12/2023 by Cleatus Arlyss RAMAN, MD.  Called Nurse Triage reporting Shortness of Breath.  Symptoms began about a month ago.  Interventions attempted: Nothing.  Symptoms are: unchanged.  Triage Disposition: See PCP When Office is Open (Within 3 Days)  Patient/caregiver understands and will follow disposition?: Yes     Copied from CRM [id number]. Topic: Clinical - Red Word Triage >> Mar 25, 2024  8:07 AM Timindy P wrote: [facility] that prompted transfer to Nurse Triage: Worsening cough, causes difficulty talking due to being winded. Reason for Disposition  [1] MODERATE longstanding difficulty breathing (e.g., speaks in phrases, SOB even at rest, pulse 100-120) AND [2] SAME as normal  Answer Assessment - Initial Assessment Questions 1. RESPIRATORY STATUS: Describe your breathing? (e.g., wheezing, shortness of breath, unable to speak, severe coughing)      Sob when she coughs 2. ONSET: When did this breathing problem begin?      02/29/24 3. PATTERN Does the difficult breathing come and go, or has it been constant since it started?      constant 4. SEVERITY: How bad is your breathing? (e.g., mild, moderate, severe)      moderate 5. RECURRENT SYMPTOM: Have you had difficulty breathing before? If Yes, ask: When was the last time? and What happened that time?      Yes in Sept and Jan, dx pneumonia 6. CARDIAC HISTORY: Do you have any history of heart disease? (e.g., heart attack, angina, bypass surgery, angioplasty)      denies 7. LUNG HISTORY: Do you have any history of lung disease?  (e.g., pulmonary embolus, asthma, emphysema)     denies 8. CAUSE: What do you think is causing the breathing problem?      cold 9. OTHER SYMPTOMS: Do you have any other symptoms? (e.g., chest pain, cough, dizziness, fever, runny nose)     Cough, sore thorat 10. O2 SATURATION MONITOR:  Do you  use an oxygen saturation monitor (pulse oximeter) at home? If Yes, ask: What is your reading (oxygen level) today? What is your usual oxygen saturation reading? (e.g., 95%)       na 11. PREGNANCY: Is there any chance you are pregnant? When was your last menstrual period?       na 12. TRAVEL: Have you traveled out of the country in the last month? (e.g., travel history, exposures)       no  Protocols used: Breathing Difficulty-A-AH

## 2024-03-25 NOTE — Patient Instructions (Addendum)
 You have an acute respiratory infection, possible bronchitis. Treat with zpack antibiotic (azithromycin ) sent to pharmacy Use medication as prescribed: tussionex Push fluids and plenty of rest. Please return if you are not improving as expected, or if you have high fevers (>101.5) or difficulty swallowing or worsening productive cough.  Call clinic with questions.  Good to see you today. I hope you start feeling better soon!

## 2024-03-25 NOTE — Telephone Encounter (Signed)
 Seen today.

## 2024-03-25 NOTE — Assessment & Plan Note (Addendum)
 Suspect acute bacterial bronchitis. Given duration and progession of symptoms (acute worsening after initial improvement), treat with zpack antibiotic to cover atypical infection. Not consistent with pneumonia (no fever, maintaining O2 sats, lung exam reassuring). Rx tussionex cough syrup. Update if not improving with treatment.

## 2024-07-23 ENCOUNTER — Encounter: Payer: Self-pay | Admitting: Family Medicine

## 2024-07-27 ENCOUNTER — Other Ambulatory Visit: Payer: Self-pay | Admitting: Family Medicine

## 2024-07-27 MED ORDER — HYDROXYZINE HCL 10 MG PO TABS: 10.0000 mg | ORAL_TABLET | Freq: Every evening | ORAL | 0 refills | Status: AC | PRN
